# Patient Record
Sex: Female | Born: 1965
Health system: Southern US, Community
[De-identification: ages and names within clinical notes are randomized; demographics above are authoritative.]

## PROBLEM LIST (undated history)

## (undated) DIAGNOSIS — T7840XA Allergy, unspecified, initial encounter: Secondary | ICD-10-CM

## (undated) DIAGNOSIS — F419 Anxiety disorder, unspecified: Secondary | ICD-10-CM

## (undated) HISTORY — DX: Anxiety disorder, unspecified: F41.9

## (undated) HISTORY — PX: WISDOM TOOTH EXTRACTION: SHX21

## (undated) HISTORY — DX: Allergy, unspecified, initial encounter: T78.40XA

## (undated) HISTORY — PX: OTHER SURGICAL HISTORY: SHX169

---

## 1997-07-15 ENCOUNTER — Other Ambulatory Visit: Admission: RE | Admit: 1997-07-15 | Discharge: 1997-07-15 | Payer: Self-pay | Admitting: Obstetrics and Gynecology

## 1998-05-30 ENCOUNTER — Other Ambulatory Visit: Admission: RE | Admit: 1998-05-30 | Discharge: 1998-05-30 | Payer: Self-pay | Admitting: Obstetrics and Gynecology

## 1998-12-15 ENCOUNTER — Inpatient Hospital Stay (HOSPITAL_COMMUNITY): Admission: AD | Admit: 1998-12-15 | Discharge: 1998-12-17 | Payer: Self-pay | Admitting: Obstetrics and Gynecology

## 1999-01-22 ENCOUNTER — Other Ambulatory Visit: Admission: RE | Admit: 1999-01-22 | Discharge: 1999-01-22 | Payer: Self-pay | Admitting: Obstetrics and Gynecology

## 2000-02-20 ENCOUNTER — Other Ambulatory Visit: Admission: RE | Admit: 2000-02-20 | Discharge: 2000-02-20 | Payer: Self-pay | Admitting: Obstetrics and Gynecology

## 2001-04-13 ENCOUNTER — Other Ambulatory Visit: Admission: RE | Admit: 2001-04-13 | Discharge: 2001-04-13 | Payer: Self-pay | Admitting: Obstetrics and Gynecology

## 2002-05-13 ENCOUNTER — Other Ambulatory Visit: Admission: RE | Admit: 2002-05-13 | Discharge: 2002-05-13 | Payer: Self-pay | Admitting: Obstetrics and Gynecology

## 2003-05-27 ENCOUNTER — Other Ambulatory Visit: Admission: RE | Admit: 2003-05-27 | Discharge: 2003-05-27 | Payer: Self-pay | Admitting: Obstetrics and Gynecology

## 2004-09-19 ENCOUNTER — Other Ambulatory Visit: Admission: RE | Admit: 2004-09-19 | Discharge: 2004-09-19 | Payer: Self-pay | Admitting: Obstetrics and Gynecology

## 2017-01-10 DIAGNOSIS — L929 Granulomatous disorder of the skin and subcutaneous tissue, unspecified: Secondary | ICD-10-CM | POA: Diagnosis not present

## 2017-01-13 DIAGNOSIS — L980 Pyogenic granuloma: Secondary | ICD-10-CM | POA: Diagnosis not present

## 2017-02-05 DIAGNOSIS — Z0000 Encounter for general adult medical examination without abnormal findings: Secondary | ICD-10-CM | POA: Diagnosis not present

## 2017-02-05 DIAGNOSIS — Z01419 Encounter for gynecological examination (general) (routine) without abnormal findings: Secondary | ICD-10-CM | POA: Diagnosis not present

## 2017-02-05 DIAGNOSIS — Z1231 Encounter for screening mammogram for malignant neoplasm of breast: Secondary | ICD-10-CM | POA: Diagnosis not present

## 2017-02-15 DIAGNOSIS — R42 Dizziness and giddiness: Secondary | ICD-10-CM | POA: Diagnosis not present

## 2017-02-15 DIAGNOSIS — N10 Acute tubulo-interstitial nephritis: Secondary | ICD-10-CM | POA: Diagnosis not present

## 2017-02-15 DIAGNOSIS — I959 Hypotension, unspecified: Secondary | ICD-10-CM | POA: Diagnosis not present

## 2017-02-24 ENCOUNTER — Encounter: Payer: Self-pay | Admitting: Family Medicine

## 2017-02-24 ENCOUNTER — Ambulatory Visit: Payer: 59 | Admitting: Family Medicine

## 2017-02-24 VITALS — BP 110/78 | HR 60 | Temp 98.0°F | Ht 63.0 in | Wt 136.0 lb

## 2017-02-24 DIAGNOSIS — Z23 Encounter for immunization: Secondary | ICD-10-CM | POA: Diagnosis not present

## 2017-02-24 DIAGNOSIS — Z1211 Encounter for screening for malignant neoplasm of colon: Secondary | ICD-10-CM | POA: Diagnosis not present

## 2017-02-24 NOTE — Progress Notes (Signed)
[name] at [facility] [address], [place], [place] [address] [phone]  Date:  02/24/2017   Name:  Nichole Mccarty   DOB:  06-Nov-1965   MRN:  [id number]  PCP:  Darreld Mclean, MD    Chief Complaint: Establish Care (Pt here to est care. Following up on recent kidney infection, finished ABX today. )   History of Present Illness:  Nichole Mccarty is a 51 y.o. 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, hospital In her free time she enjoys yoga, hiking, biking She had 2 vaginal births, otherwise she has been generally healthy  She does use lexapro 10, and myrbetreiq which she uses prn when she will not be near a bathroom  Her mamo is per her GYN, and pap on 02/05/17 She has not yet done colon cancer screening- she did go for a consultation for a colonoscopy She would like to have cologuard - we will arrange this She gets labs drawn at her GYN office Last tetanus - may have been many years ago She will also get a flu shot today  Reviewed her recent labs from GYN 11/28- CBC, CMP, lipids, TSH, pap- all look ok   There are no active problems to display for this patient.   No past medical history on file.    Social History   Tobacco Use  . Smoking status: Never Smoker  . Smokeless tobacco: Never Used  Substance Use Topics  . Alcohol use: No   Frequency: Never  . Drug use: No    No family history on file.  Allergies  Allergen Reactions  . No Known Allergies     Medication list has been reviewed and updated.  Current Outpatient Medications on File Prior to Visit  Medication Sig Dispense Refill  . escitalopram (LEXAPRO) 10 MG tablet escitalopram 10 mg tablet    . levonorgestrel (MIRENA, 52 MG,) 20 MCG/24HR IUD Mirena 20 mcg/24 hr (5 years) intrauterine device  Take by intrauterine route.    . ciprofloxacin (CIPRO) 500 MG tablet TK 1 T PO BID WF  0  . meclizine (ANTIVERT) 25 MG tablet TK 1 T PO TID PRN  0   No current facility-administered medications on file prior to visit.     Review of Systems:  As per HPI- otherwise negative. No fever or chills No CP or SOB   Physical Examination: Vitals:   02/24/17 1529  BP: 110/78  Pulse: 60  Temp: 98 F (36.7 C)  SpO2: 98%   There were no vitals filed for this visit. There is no height or weight on file to calculate BMI. Ideal Body Weight:    GEN: WDWN, NAD, Non-toxic, A & O x 3, looks well, overweight HEENT: Atraumatic, Normocephalic. Neck supple. No  masses, No LAD.  Bilateral TM wnl, oropharynx normal.  PEERL,EOMI.   Ears and Nose: No external deformity. CV: RRR, No M/G/R. No JVD. No thrill. No extra heart sounds. PULM: CTA B, no wheezes, crackles, rhonchi. No retractions. No resp. distress. No accessory muscle use. ABD: S, NT, ND EXTR: No c/c/e NEURO Normal gait.  PSYCH: Normally interactive. Conversant. Not depressed or anxious appearing.  Calm demeanor.    Assessment and Plan: Immunization due - Plan: Tdap vaccine greater than or equal to 7yo IM, Flu Vaccine QUAD 36+ mos IM  Colon cancer screening  Updated tdap and flu today Ordered cologuard for her Otherwise she does not have any concerns today  She will see me as needed   Signed Lamar Blinks, MD

## 2017-02-24 NOTE — Patient Instructions (Addendum)
It was nice to see you today!  Take care and let me know if you need anything We will set you up for a cologuard test- do double check with your insurance to make sure they cover this test  You got your tetanus and flu shots today

## 2017-03-02 ENCOUNTER — Encounter: Payer: Self-pay | Admitting: Family Medicine

## 2017-05-06 NOTE — Progress Notes (Signed)
[facility] [address] [place], [place] [address] Phone: [phone] Subjective:    I'm seeing this patient by the request  of:   Copland, Gay Filler, MD   CC: Right knee pain  [id number]  Nichole Mccarty is a 52 y.o. female coming in with complaint of right knee pain. She states she walked 10 miles one day and ran 8 miles back to back when she felt her pain. Has stopped running but continues to walk on an incline. Was swollen initially.   Onset- Christmas day Location- Medial Duration- Wakes her up at night Character- Aches  Aggravating factors- Certain yoga poses Reliving factors-  Therapies tried- Aleve, RICE Severity-6 out of 10.     History reviewed. No pertinent past medical history. History reviewed. No pertinent surgical history. Social History   Socioeconomic History  . Marital status: Single    Spouse name: None  . Number of children: None  . Years of education: None  . Highest education level: None  Social Needs  . Financial resource strain: None  . Food insecurity - worry: None  . Food insecurity - inability: None  . Transportation needs - medical: None  . Transportation needs - non-medical: None  Occupational History  . Occupation: Sales  Tobacco Use  . Smoking status: Never Smoker  . Smokeless tobacco: Never Used  Substance and Sexual Activity  . Alcohol use: No    Frequency: Never  . Drug use: No  . Sexual activity: None  Other Topics Concern  . None  Social History Narrative  . None   Allergies  Allergen Reactions  . No Known Allergies    History reviewed. No pertinent family history.   Past medical history, social, surgical and family history all reviewed in electronic medical record.  No pertanent information unless stated regarding to the chief complaint.   Review of Systems:Review of systems updated and as accurate as of 05/07/17  No headache, visual changes, nausea, vomiting, diarrhea,  constipation, dizziness, abdominal pain, skin rash, fevers, chills, night sweats, weight loss, swollen lymph nodes, body aches, joint swelling, muscle aches, chest pain, shortness of breath, mood changes.   Objective  Blood pressure 118/80, pulse 63, height 5\' 2"  (1.575 m), weight 135 lb (61.2 kg), SpO2 98 %. Systems examined below as of 05/07/17   General: No apparent distress alert and oriented x3 mood and affect normal, dressed appropriately.  HEENT: Pupils equal, extraocular movements intact  Respiratory: Patient's speak in full sentences and does not appear short of breath  Cardiovascular: No lower extremity edema, non tender, no erythema  Skin: Warm dry intact with no signs of infection or rash on extremities or on axial skeleton.  Abdomen: Soft nontender  Neuro: Cranial nerves II through XII are intact, neurovascularly intact in all extremities with 2+ DTRs and 2+ pulses.  Lymph: No lymphadenopathy of posterior or anterior cervical chain or axillae bilaterally.  Gait normal with good balance and coordination.  MSK:  Non tender with full range of motion and good stability and symmetric strength and tone of shoulders, elbows, wrist, hip and ankles bilaterally.  Knee: Right Normal to inspection with no erythema or effusion or obvious bony abnormalities. Tender over the medial joint line mild overlying lipoma noted ROM full in flexion and extension and lower leg rotation. Mild laxity MCL but does have a positive endpoint.  All other ligaments intact Negative Mcmurray's, Apley's, and Thessalonian tests. Non painful patellar compression. Patellar glide with mild  to moderate crepitus. Patellar and quadriceps tendons unremarkable. Hamstring and quadriceps strength is normal. Contralateral knee unremarkable  MSK US performed of: Right knee  this study was ordered, performed, and interpreted by Charlann Boxer D.O.  Knee: Medial meniscus does show some degenerative changes but no acute tear  appreciated.  Patient's MCL does have a partial articular side tear proximally.  No significant gapping noted.  Mild tricompartmental arthritis.  Synovitis noted of the patellofemoral joint.  IMPRESSION: Mild osteoarthritic changes, synovitis, partial MCL tear    Impression and Recommendations:     This case required medical decision making of moderate complexity.      Note: This dictation was prepared with Dragon dictation along with smaller phrase technology. Any transcriptional errors that result from this process are unintentional.

## 2017-05-07 ENCOUNTER — Ambulatory Visit: Payer: Self-pay

## 2017-05-07 ENCOUNTER — Other Ambulatory Visit: Payer: Self-pay

## 2017-05-07 ENCOUNTER — Ambulatory Visit: Payer: 59 | Admitting: Family Medicine

## 2017-05-07 ENCOUNTER — Encounter: Payer: Self-pay | Admitting: Family Medicine

## 2017-05-07 ENCOUNTER — Ambulatory Visit (INDEPENDENT_AMBULATORY_CARE_PROVIDER_SITE_OTHER)
Admission: RE | Admit: 2017-05-07 | Discharge: 2017-05-07 | Disposition: A | Discharge status: Home / Self Care | Payer: 59 | Source: Ambulatory Visit | Attending: Family Medicine | Admitting: Family Medicine

## 2017-05-07 VITALS — BP 118/80 | HR 63 | Ht 62.0 in | Wt 135.0 lb

## 2017-05-07 DIAGNOSIS — M25561 Pain in right knee: Principal | ICD-10-CM

## 2017-05-07 DIAGNOSIS — G8929 Other chronic pain: Secondary | ICD-10-CM

## 2017-05-07 DIAGNOSIS — S83411A Sprain of medial collateral ligament of right knee, initial encounter: Secondary | ICD-10-CM

## 2017-05-07 MED ORDER — DICLOFENAC SODIUM 2 % TD SOLN: 2.0000 g | Freq: Two times a day (BID) | TRANSDERMAL | 3 refills | Status: DC

## 2017-05-07 MED ORDER — MELOXICAM 15 MG PO TABS: 15.0000 mg | ORAL_TABLET | Freq: Every day | ORAL | 0 refills | Status: DC

## 2017-05-07 NOTE — Patient Instructions (Signed)
Good to see you  Partial MCL tear.  pennsaid pinkie amount topically 2 times daily as needed.  Meloxicam daily for 10 days Wear brace with working out.  Ice 20 minutes 2 times daily. Usually after activity and before bed. Biking or elliptical would be better Vitamin D 2000 IU daily  Turmeric 500mg  twice daily  See me again in 4 weeks.

## 2017-05-07 NOTE — Progress Notes (Unsigned)
pennsaid

## 2017-05-07 NOTE — Assessment & Plan Note (Signed)
Partial tear noted.  Does have a synovitis.  X-rays ordered today.  Mild to moderate arthritic changes.  Discussed with patient given brace, home exercises, topical anti-inflammatories.  If patient has any worsening symptoms advanced imaging would be warranted if there is any locking or giving out on her.  Follow-up again in 4 weeks

## 2017-05-21 ENCOUNTER — Telehealth: Payer: Self-pay

## 2017-05-21 NOTE — Telephone Encounter (Signed)
Patient called to inquire about the price of the brace. Told her that the hinged knee brace is billed out at $156.89. Provided patient with [employer] phone number  [phone] in which she can call to return brace. Advised that brace needs to be returned within 14 day time frame and that she should call today but can bring the brace into the office today or tomorrow if she decides to return it. Patient is going to check again with her insurance company to note coverage before making final decision.

## 2017-06-02 ENCOUNTER — Other Ambulatory Visit: Payer: Self-pay | Admitting: Family Medicine

## 2017-06-04 ENCOUNTER — Ambulatory Visit: Payer: 59 | Admitting: Family Medicine

## 2017-06-04 ENCOUNTER — Encounter: Payer: Self-pay | Admitting: Family Medicine

## 2017-06-04 DIAGNOSIS — S83411A Sprain of medial collateral ligament of right knee, initial encounter: Secondary | ICD-10-CM | POA: Diagnosis not present

## 2017-06-04 NOTE — Progress Notes (Signed)
[facility] [place], [place] [address] Phone: [phone] Subjective:    I'm seeing this patient by the request  of:    CC: Knee pain follow-up  [id number]  Nichole Mccarty is a 52 y.o. female coming in with complaint of knee pain.  Found to have more of an MCL partial tear.  Patient has been wearing the brace, doing home exercises, topical anti-inflammatories.  Patient feels like the exercises are helping a little bit.  Has not been running so does not know how much she is truly improving.     No past medical history on file. No past surgical history on file. Social History   Socioeconomic History  . Marital status: Single    Spouse name: Not on file  . Number of children: Not on file  . Years of education: Not on file  . Highest education level: Not on file  Occupational History  . Occupation: Press photographer  Social Needs  . Financial resource strain: Not on file  . Food insecurity:    Worry: Not on file    Inability: Not on file  . Transportation needs:    Medical: Not on file    Non-medical: Not on file  Tobacco Use  . Smoking status: Never Smoker  . Smokeless tobacco: Never Used  Substance and Sexual Activity  . Alcohol use: No    Frequency: Never  . Drug use: No  . Sexual activity: Not on file  Lifestyle  . Physical activity:    Days per week: Not on file    Minutes per session: Not on file  . Stress: Not on file  Relationships  . Social connections:    Talks on phone: Not on file    Gets together: Not on file    Attends religious service: Not on file    Active member of club or organization: Not on file    Attends meetings of clubs or organizations: Not on file    Relationship status: Not on file  Other Topics Concern  . Not on file  Social History Narrative  . Not on file   Allergies  Allergen Reactions  . No Known Allergies    No family history on file.   Past medical history, social, surgical and family  history all reviewed in electronic medical record.  No pertanent information unless stated regarding to the chief complaint.   Review of Systems:Review of systems updated and as accurate as of 06/04/17  No headache, visual changes, nausea, vomiting, diarrhea, constipation, dizziness, abdominal pain, skin rash, fevers, chills, night sweats, weight loss, swollen lymph nodes, body aches, joint swelling, muscle aches, chest pain, shortness of breath, mood changes.   Objective  Blood pressure 110/80, pulse 88, height 5\' 2"  (1.575 m), weight 131 lb (59.4 kg), SpO2 98 %. Systems examined below as of 06/04/17   General: No apparent distress alert and oriented x3 mood and affect normal, dressed appropriately.  HEENT: Pupils equal, extraocular movements intact  Respiratory: Patient's speak in full sentences and does not appear short of breath  Cardiovascular: No lower extremity edema, non tender, no erythema  Skin: Warm dry intact with no signs of infection or rash on extremities or on axial skeleton.  Abdomen: Soft nontender  Neuro: Cranial nerves II through XII are intact, neurovascularly intact in all extremities with 2+ DTRs and 2+ pulses.  Lymph: No lymphadenopathy of posterior or anterior cervical chain or axillae bilaterally.  Gait normal with  good balance and coordination.  MSK:  Non tender with full range of motion and good stability and symmetric strength and tone of shoulders, elbows, wrist, hip, and ankles bilaterally.  Knee: Right Normal to inspection with no erythema or effusion or obvious bony abnormalities. Palpation mild discomfort over the medial joint space ROM full in flexion and extension and lower leg rotation. Ligaments with solid consistent endpoints including ACL, PCL, LCL, MCL. Negative Mcmurray's, Apley's, and Thessalonian tests. Very mild painful patellar compression. Patellar glide with mild crepitus. Patellar and quadriceps tendons unremarkable. Hamstring and  quadriceps strength is normal. Contralateral knee unremarkable     Impression and Recommendations:     This case required medical decision making of moderate complexity.      Note: This dictation was prepared with Dragon dictation along with smaller phrase technology. Any transcriptional errors that result from this process are unintentional.

## 2017-06-04 NOTE — Patient Instructions (Signed)
Good to see you  I think you are doing great  Brace with running  Start a walk-run progression: - Initially start one minute walking than one minute running for 30 mins in the first week,  - Run 2 mins, then walk 1 min second week  -Then run 3 mins, and walk 1 min. -Then run 4 mins, and walk 1 min. -Then run 5 mins, and walk 1 min. -Slowly build up weekly to running 30 mins nonstop.  Ice right after running  OK to continue the vitamins After 2 weeks stop the ibuprofen and aleve if you can  See me again in 4 weeks but hike before you see me  See you again in 4 weeks

## 2017-06-04 NOTE — Assessment & Plan Note (Signed)
Seems to be improving.  Running progression given, discussed icing regimen and home exercise.  No significant swelling noted.  Follow-up again in 4-6 weeks

## 2017-06-06 ENCOUNTER — Encounter: Payer: Self-pay | Admitting: Family Medicine

## 2017-06-06 DIAGNOSIS — H40013 Open angle with borderline findings, low risk, bilateral: Secondary | ICD-10-CM | POA: Diagnosis not present

## 2017-06-06 DIAGNOSIS — H40053 Ocular hypertension, bilateral: Secondary | ICD-10-CM | POA: Diagnosis not present

## 2017-06-16 ENCOUNTER — Other Ambulatory Visit: Payer: Self-pay

## 2017-06-16 ENCOUNTER — Encounter: Payer: Self-pay | Admitting: Family Medicine

## 2017-06-16 DIAGNOSIS — G8929 Other chronic pain: Secondary | ICD-10-CM

## 2017-06-16 DIAGNOSIS — M25561 Pain in right knee: Principal | ICD-10-CM

## 2017-07-01 NOTE — Progress Notes (Signed)
[facility] [place], [place] [address] Phone: [phone] Subjective:      CC: Right knee pain  [id number]  Nichole Mccarty is a 52 y.o. female coming in with complaint of right knee pain. She did try running which caused pain for a couple days. She has not ran since that incident. Patient did turn on the right leg with her foot planted. She had instant sharp pain. She is able to ride a bike without pain. She is waking up in pain. Has been using ice, IBU and Aleve.  Patient was seen previously and had more of a sprain with a partial tear of the MCL.  Patient states that this feels a little bit different.  Has not run since the incident.      No past medical history on file. No past surgical history on file. Social History   Socioeconomic History  . Marital status: Single    Spouse name: Not on file  . Number of children: Not on file  . Years of education: Not on file  . Highest education level: Not on file  Occupational History  . Occupation: Press photographer  Social Needs  . Financial resource strain: Not on file  . Food insecurity:    Worry: Not on file    Inability: Not on file  . Transportation needs:    Medical: Not on file    Non-medical: Not on file  Tobacco Use  . Smoking status: Never Smoker  . Smokeless tobacco: Never Used  Substance and Sexual Activity  . Alcohol use: No    Frequency: Never  . Drug use: No  . Sexual activity: Not on file  Lifestyle  . Physical activity:    Days per week: Not on file    Minutes per session: Not on file  . Stress: Not on file  Relationships  . Social connections:    Talks on phone: Not on file    Gets together: Not on file    Attends religious service: Not on file    Active member of club or organization: Not on file    Attends meetings of clubs or organizations: Not on file    Relationship status: Not on file  Other Topics Concern  . Not on file  Social History Narrative  . Not on  file   Allergies  Allergen Reactions  . No Known Allergies    No family history on file.   Past medical history, social, surgical and family history all reviewed in electronic medical record.  No pertanent information unless stated regarding to the chief complaint.   Review of Systems:Review of systems updated and as accurate as of 07/01/17  No headache, visual changes, nausea, vomiting, diarrhea, constipation, dizziness, abdominal pain, skin rash, fevers, chills, night sweats, weight loss, swollen lymph nodes, body aches, joint swelling, muscle aches, chest pain, shortness of breath, mood changes.   Objective  There were no vitals taken for this visit. Systems examined below as of 07/01/17   General: No apparent distress alert and oriented x3 mood and affect normal, dressed appropriately.  HEENT: Pupils equal, extraocular movements intact  Respiratory: Patient's speak in full sentences and does not appear short of breath  Cardiovascular: No lower extremity edema, non tender, no erythema  Skin: Warm dry intact with no signs of infection or rash on extremities or on axial skeleton.  Abdomen: Soft nontender  Neuro: Cranial nerves II through XII are intact, neurovascularly intact in  all extremities with 2+ DTRs and 2+ pulses.  Lymph: No lymphadenopathy of posterior or anterior cervical chain or axillae bilaterally.  Gait normal with good balance and coordination.  MSK:  Non tender with full range of motion and good stability and symmetric strength and tone of shoulders, elbows, wrist, hip, and ankles bilaterally.  Knee: Right Normal to inspection with no erythema or effusion or obvious bony abnormalities. Palpation normal with no warmth, joint line tenderness, patellar tenderness, or condyle tenderness. ROM full in flexion and extension and lower leg rotation. Ligaments with solid consistent endpoints including ACL, PCL, LCL, some laxity of MCL but does have an endpoint Rebound positive  Mcmurray's, Apley's, and Thessalonian tests. Non painful patellar compression. Patellar glide with mild crepitus. Patellar and quadriceps tendons unremarkable. Hamstring and quadriceps strength is normal.  MSK US performed of: Right knee This study was ordered, performed, and interpreted by Charlann Boxer D.O.  Knee: MCL tear noted previously does show approximately 30% healing noted.  Scar tissue formation noted.  Dynamic testing does not show any significant increase in gapping.  Patient does have an underlying new acute meniscal tear with minimal displacement of less than 25%.  Hypoechoic changes noted.  IMPRESSION:  .  Partial healing of the MCL, new acute medial meniscal tear   Impression and Recommendations:     This case required medical decision making of moderate complexity.      Note: This dictation was prepared with Dragon dictation along with smaller phrase technology. Any transcriptional errors that result from this process are unintentional.

## 2017-07-02 ENCOUNTER — Ambulatory Visit: Payer: 59 | Admitting: Family Medicine

## 2017-07-02 ENCOUNTER — Ambulatory Visit: Payer: Self-pay

## 2017-07-02 ENCOUNTER — Encounter: Payer: Self-pay | Admitting: Family Medicine

## 2017-07-02 ENCOUNTER — Other Ambulatory Visit: Payer: Self-pay | Admitting: Family Medicine

## 2017-07-02 VITALS — BP 110/70 | HR 66 | Ht 62.0 in | Wt 132.0 lb

## 2017-07-02 DIAGNOSIS — G8929 Other chronic pain: Secondary | ICD-10-CM

## 2017-07-02 DIAGNOSIS — M25561 Pain in right knee: Secondary | ICD-10-CM

## 2017-07-02 DIAGNOSIS — S83411A Sprain of medial collateral ligament of right knee, initial encounter: Secondary | ICD-10-CM

## 2017-07-02 DIAGNOSIS — S83242A Other tear of medial meniscus, current injury, left knee, initial encounter: Secondary | ICD-10-CM

## 2017-07-02 NOTE — Assessment & Plan Note (Signed)
As stated above. 

## 2017-07-02 NOTE — Assessment & Plan Note (Signed)
Healing interval but now a meniscal tear.  Patient is to be starting physical therapy in the near future.  We discussed icing regimen.  Patient declined any advanced imaging.  Patient will try to increase activity as tolerated.  Patient has a hiking trip in June and if not better will consider injection at follow-up.

## 2017-07-02 NOTE — Patient Instructions (Signed)
Good to see you  Ice Is your friend.  Ice 20 minutes 2 times daily. Usually after activity and before bed. Keep using the pennsaid at night Physical therapy will be great  Biking, elliptical and swimming would be fine See me again in 5 weeks or so and if needed we will do injection for the knee before your hike.

## 2017-07-03 NOTE — Telephone Encounter (Signed)
Refill done.  

## 2017-07-09 ENCOUNTER — Encounter: Payer: Self-pay | Admitting: Physical Therapy

## 2017-07-09 ENCOUNTER — Ambulatory Visit: Payer: 59 | Attending: Family Medicine | Admitting: Physical Therapy

## 2017-07-09 DIAGNOSIS — M25561 Pain in right knee: Secondary | ICD-10-CM | POA: Diagnosis present

## 2017-07-09 NOTE — Therapy (Addendum)
[facility] [place], [place], [address] Phone: [phone]   Fax:  [phone]  Physical Therapy Evaluation  Patient Details  Name: Nichole Mccarty MRN: [id number] Date of Birth: [birth year] Referring Provider: Creig Hines   Encounter Date: 07/09/2017  PT End of Session - 07/09/17 1618    Visit Number  1    Date for PT Re-Evaluation  09/08/17    PT Start Time  7048    PT Stop Time  1530    PT Time Calculation (min)  45 min    Activity Tolerance  Patient tolerated treatment well    Behavior During Therapy  [facility] for tasks assessed/performed       History reviewed. No pertinent past medical history.  History reviewed. No pertinent surgical history.  There were no vitals filed for this visit.   Subjective Assessment - 07/09/17 1458    Subjective  Patient reports that she started having pain on a 6 mile run, she was diagnosed with a meniscus and MCL tear, partial.  She reports that she a month ago she twisted at the grocery store and flared up the pain again.  She reports that she rested and put ice on it and it is getting better, she has a brace and has done some walking    Limitations  Walking    Patient Stated Goals  go on a long hike in [place]    Currently in Pain?  Yes    Pain Score  1     Pain Location  Knee    Pain Orientation  Right    Pain Descriptors / Indicators  Aching;Dull    Pain Onset  More than a month ago    Pain Frequency  Intermittent    Aggravating Factors   twisting, running pain in the last month up to 6/10    Pain Relieving Factors  rest, ibuprofen, ice pain has been 0-1/10 over the past week    Effect of Pain on Daily Activities  has not been doing the normal exercise routine         [facility] PT Assessment - 07/09/17 0001      Assessment   Medical Diagnosis  partial medial meniscus and partial MCL tear    Referring Provider  Z. Smith    Onset Date/Surgical Date  07/09/17    Prior  Therapy  no      Precautions   Precautions  None      Balance Screen   Has the patient fallen in the past 6 months  No    Has the patient had a decrease in activity level because of a fear of falling?   No    Is the patient reluctant to leave their home because of a fear of falling?   No      Home Environment   Additional Comments  has stairs, some yardwork,       Prior Function   Level of Independence  Independent    Vocation  Full time employment    [employer], a lot of sitting      ROM / Strength   AROM / PROM / Strength  AROM;Strength      AROM   Overall AROM Comments  AROM of the right knee is WNL's with mild medial joint line pain with full flexion      Strength   Overall Strength Comments  WFL's mild pain iwth resisted  flexion      Flexibility   Soft Tissue Assessment /Muscle Length  yes    ITB  tight      Palpation   Palpation comment  some tenderness medial joint line, she has crepitus of the patellae with extension, she has some lateral tracking patella as well, the ITB feels very tight      Ambulation/Gait   Gait Comments  no deviation                Objective measurements completed on examination: See above findings.      [facility] PT Treatment/Exercise - 07/09/17 0001      Modalities   Modalities  Iontophoresis      Iontophoresis   Type of Iontophoresis  Dexamethasone    Location  right medial knee    Dose  5m    Time  4 hour patch             PT Education - 07/09/17 1618    Education provided  Yes    Education Details  spoke about safely starting hiking and how to limit rotational stresses    Person(s) Educated  Patient    Methods  Explanation    Comprehension  Verbalized understanding       PT Short Term Goals - 07/09/17 1729      PT SHORT TERM GOAL #1   Title  independent with initial HEP    Time  1    Period  Days    Status  Achieved        PT Long Term Goals - 07/09/17 1729      PT  LONG TERM GOAL #1   Title  decrease pain 50%    Time  8    Period  Weeks    Status  New      PT LONG TERM GOAL #2   Title  go on hiking vacation without difficulty    Time  8    Period  Weeks    Status  New             Plan - 07/09/17 1704    Clinical Impression Statement  Patient reports right knee pain starting during a run on Christmas.  She reports that she got some better and recently she twisted on the leg and had the pain return, she has been diagnosed with partial MCL tear and medial meniscus tear, she does not want surgery.  She is very active but I cautioned her to avoid hyper flexion that she was doing in some of her exercise routines.  Her ROM and strength is WNL's.  She is planning a bike hiking vacation in 1 month and is looking to help the knee.  She is tight in the ITB    Clinical Presentation  Stable    Clinical Decision Making  Low    Rehab Potential  Good    PT Frequency  1x / week    PT Duration  8 weeks    PT Treatment/Interventions  Cryotherapy;Electrical Stimulation;Iontophoresis 459mml Dexamethasone;Gait training;Stair training;Functional mobility training;Therapeutic activities;Therapeutic exercise;Balance training;Neuromuscular re-education;Manual techniques;Patient/family education    PT Next Visit Plan  she will try exercises, we ,may see again if ionto helped,     Consulted and Agree with Plan of Care  Patient       Patient will benefit from skilled therapeutic intervention in order to improve the following deficits and impairments:  Decreased range of motion, Difficulty walking, Pain, Impaired flexibility  Visit Diagnosis: Acute pain of right knee - Plan: PT plan of care cert/re-cert     Problem List Patient Active Problem List   Diagnosis Date Noted  . Acute medial meniscal tear, left, initial encounter 07/02/2017  . Tear of MCL (medial collateral ligament) of knee, right, initial encounter 05/07/2017  PHYSICAL THERAPY DISCHARGE  SUMMARY  Visits from Start of Care: 1   Plan: Patient agrees to discharge.  Patient goals were partially met. Patient is being discharged due to being pleased with the current functional level.  ?????       Sumner Boast., PT 07/09/2017, 5:41 PM  [place], [place], [address] Phone: [phone]   Fax:  [phone]  Name: Nichole Mccarty MRN: [id number] Date of Birth: [birth year]

## 2017-08-06 ENCOUNTER — Ambulatory Visit: Payer: 59 | Admitting: Family Medicine

## 2017-08-07 ENCOUNTER — Encounter: Payer: Self-pay | Admitting: Family Medicine

## 2017-08-07 ENCOUNTER — Ambulatory Visit: Payer: 59 | Admitting: Family Medicine

## 2017-08-07 DIAGNOSIS — S83242A Other tear of medial meniscus, current injury, left knee, initial encounter: Secondary | ICD-10-CM | POA: Diagnosis not present

## 2017-08-07 NOTE — Progress Notes (Signed)
[facility] [place], [place] [address] Phone: [phone] Subjective:      CC: Knee pain  HCW:[id number]  Nichole Mccarty is a 52 y.o. female coming in with complaint of knee pain. She has been doing physical therapy. She has been using a medication patch on the knee from the therapist. Patient did hike on [place] wearing the knee brace. Did not have any pain with that activity. Did use Aleve.  Patient was found to have a partial MCL tear that seem to be healing and will start running progression.  Some degenerative meniscal tearing noted and some mild to moderate arthritic changes.  Patient will be doing a lot of hiking and feels that something needs to be done to help her make sure she does not have significant amount of pain.    No past medical history on file. No past surgical history on file. Social History   Socioeconomic History  . Marital status: Single    Spouse name: Not on file  . Number of children: Not on file  . Years of education: Not on file  . Highest education level: Not on file  Occupational History  . Occupation: Press photographer  Social Needs  . Financial resource strain: Not on file  . Food insecurity:    Worry: Not on file    Inability: Not on file  . Transportation needs:    Medical: Not on file    Non-medical: Not on file  Tobacco Use  . Smoking status: Never Smoker  . Smokeless tobacco: Never Used  Substance and Sexual Activity  . Alcohol use: No    Frequency: Never  . Drug use: No  . Sexual activity: Not on file  Lifestyle  . Physical activity:    Days per week: Not on file    Minutes per session: Not on file  . Stress: Not on file  Relationships  . Social connections:    Talks on phone: Not on file    Gets together: Not on file    Attends religious service: Not on file    Active member of club or organization: Not on file    Attends meetings of clubs or organizations: Not on file    Relationship  status: Not on file  Other Topics Concern  . Not on file  Social History Narrative  . Not on file   Allergies  Allergen Reactions  . No Known Allergies    No family history on file.   Past medical history, social, surgical and family history all reviewed in electronic medical record.  No pertanent information unless stated regarding to the chief complaint.   Review of Systems:Review of systems updated and as accurate as of 08/07/17  No headache, visual changes, nausea, vomiting, diarrhea, constipation, dizziness, abdominal pain, skin rash, fevers, chills, night sweats, weight loss, swollen lymph nodes, body aches, joint swelling, muscle aches, chest pain, shortness of breath, mood changes.   Objective  Blood pressure 112/68, pulse 72, height 5\' 2"  (1.575 m), weight 132 lb (59.9 kg), SpO2 98 %. Systems examined below as of 08/07/17   General: No apparent distress alert and oriented x3 mood and affect normal, dressed appropriately.  HEENT: Pupils equal, extraocular movements intact  Respiratory: Patient's speak in full sentences and does not appear short of breath  Cardiovascular: No lower extremity edema, non tender, no erythema  Skin: Warm dry intact with no signs of infection or rash on extremities or on  axial skeleton.  Abdomen: Soft nontender  Neuro: Cranial nerves II through XII are intact, neurovascularly intact in all extremities with 2+ DTRs and 2+ pulses.  Lymph: No lymphadenopathy of posterior or anterior cervical chain or axillae bilaterally.  Gait normal with good balance and coordination.  MSK:  Non tender with full range of motion and good stability and symmetric strength and tone of shoulders, elbows, wrist, hip, and ankles bilaterally.  Right knee exam shows the patient has near full range of motion.  Still some mild laxity but good endpoint mild positive McMurray still noted.  Mild crepitus with range of motion.  Mild pain over the medial compartment  After informed  written and verbal consent, patient was seated on exam table. Right knee was prepped with alcohol swab and utilizing anterolateral approach, patient's right knee space was injected with 4:1  marcaine 0.5%: Kenalog 40mg /dL. Patient tolerated the procedure well without immediate complications.    Impression and Recommendations:     This case required medical decision making of moderate complexity.      Note: This dictation was prepared with Dragon dictation along with smaller phrase technology. Any transcriptional errors that result from this process are unintentional.

## 2017-08-07 NOTE — Assessment & Plan Note (Signed)
Injected Aug 07, 2017.  I think patient will do well with conservative therapy.  Has had difficulty with increasing her running secondary to the discomfort and pain.  We discussed icing regimen, home exercises, which activities of doing which wants to avoid.  Patient will follow-up with me again in 4 weeks

## 2017-08-07 NOTE — Patient Instructions (Signed)
Good to see you  I want to see pictures Stay active See me again in 4 weeks if you need me

## 2017-08-09 ENCOUNTER — Encounter: Payer: Self-pay | Admitting: Family Medicine

## 2017-09-03 ENCOUNTER — Ambulatory Visit: Payer: 59 | Admitting: Family Medicine

## 2017-10-08 ENCOUNTER — Ambulatory Visit: Payer: 59 | Admitting: Family Medicine

## 2017-11-16 ENCOUNTER — Encounter: Payer: Self-pay | Admitting: Family Medicine

## 2017-11-17 MED ORDER — ESCITALOPRAM OXALATE 10 MG PO TABS: ORAL_TABLET | ORAL | 3 refills | Status: DC

## 2017-11-17 NOTE — Telephone Encounter (Signed)
I have pended medication please advise.

## 2017-11-18 MED ORDER — ESCITALOPRAM OXALATE 10 MG PO TABS: ORAL_TABLET | ORAL | 3 refills | Status: DC

## 2017-11-18 NOTE — Addendum Note (Signed)
Addended by: Lamar Blinks C on: 11/18/2017 03:44 PM   Modules accepted: Orders

## 2017-11-21 MED ORDER — ESCITALOPRAM OXALATE 10 MG PO TABS: ORAL_TABLET | ORAL | 3 refills | Status: DC

## 2017-11-21 NOTE — Addendum Note (Signed)
Addended by: Lamar Blinks C on: 11/21/2017 03:33 PM   Modules accepted: Orders

## 2018-02-19 DIAGNOSIS — Z01419 Encounter for gynecological examination (general) (routine) without abnormal findings: Secondary | ICD-10-CM | POA: Diagnosis not present

## 2018-02-19 DIAGNOSIS — Z6824 Body mass index (BMI) 24.0-24.9, adult: Secondary | ICD-10-CM | POA: Diagnosis not present

## 2018-02-19 DIAGNOSIS — Z1231 Encounter for screening mammogram for malignant neoplasm of breast: Secondary | ICD-10-CM | POA: Diagnosis not present

## 2018-02-19 DIAGNOSIS — Z23 Encounter for immunization: Secondary | ICD-10-CM | POA: Diagnosis not present

## 2018-03-05 ENCOUNTER — Telehealth: Payer: Self-pay | Admitting: *Deleted

## 2018-03-05 NOTE — Telephone Encounter (Signed)
Received Mammogram results from [facility]; forwarded to provider/SLS 12/26

## 2018-04-06 DIAGNOSIS — F419 Anxiety disorder, unspecified: Secondary | ICD-10-CM | POA: Insufficient documentation

## 2018-04-07 DIAGNOSIS — H53481 Generalized contraction of visual field, right eye: Secondary | ICD-10-CM | POA: Diagnosis not present

## 2018-04-07 DIAGNOSIS — H02413 Mechanical ptosis of bilateral eyelids: Secondary | ICD-10-CM | POA: Diagnosis not present

## 2018-04-07 DIAGNOSIS — H02831 Dermatochalasis of right upper eyelid: Secondary | ICD-10-CM | POA: Diagnosis not present

## 2018-04-07 DIAGNOSIS — H02834 Dermatochalasis of left upper eyelid: Secondary | ICD-10-CM | POA: Diagnosis not present

## 2018-04-07 DIAGNOSIS — H0279 Other degenerative disorders of eyelid and periocular area: Secondary | ICD-10-CM | POA: Diagnosis not present

## 2018-04-07 DIAGNOSIS — H53482 Generalized contraction of visual field, left eye: Secondary | ICD-10-CM | POA: Diagnosis not present

## 2018-04-07 DIAGNOSIS — H53483 Generalized contraction of visual field, bilateral: Secondary | ICD-10-CM | POA: Diagnosis not present

## 2018-04-15 ENCOUNTER — Encounter: Payer: Self-pay | Admitting: Family Medicine

## 2018-04-16 ENCOUNTER — Other Ambulatory Visit: Payer: Self-pay | Admitting: Family Medicine

## 2018-05-02 NOTE — Progress Notes (Deleted)
[place] [facility] [address] [place], [place] [address] [phone]  Date:  05/06/2018   Name:  Nichole Mccarty   DOB:  08-09-1965   MRN:  [id number]  PCP:  Darreld Mclean, MD    Chief Complaint: No chief complaint on file.   History of Present Illness:  Nichole Mccarty is a 53 y.o. very pleasant female patient who presents with the following:  Here today for a follow-up visit-question physical exam I have so far seen her in the office once, in December 2018  She is generally in good health, single with 2 adult daughters She is in [employer], and enjoys exercise including yoga, hiking, biking  Colon cancer screening: Flu shot Mammo-is up-to-date Pap is up-to-date Can do routine labs today  Patient Active Problem List   Diagnosis Date Noted  . Acute medial meniscal tear, left, initial encounter 07/02/2017  . Tear of MCL (medial collateral ligament) of knee, right, initial encounter 05/07/2017    No past medical history on file.  No past surgical history on file.  Social History   Tobacco Use  . Smoking status: Never Smoker  . Smokeless tobacco: Never Used  Substance Use Topics  . Alcohol use: No    Frequency: Never  . Drug use: No    No family history on file.  Allergies  Allergen Reactions  . No Known Allergies     Medication list has been reviewed and updated.  Current Outpatient Medications on File Prior to Visit  Medication Sig Dispense Refill  . Calcium Carbonate (CALCIUM 600 PO) Take 1 tablet by mouth daily.    . ciprofloxacin (CIPRO) 500 MG tablet TK 1 T PO BID WF  0  . Diclofenac Sodium (PENNSAID) 2 % SOLN Place 2 g onto the skin 2 (two) times daily. 112 g 3  . escitalopram (LEXAPRO) 10 MG tablet Take 1 daily 90 tablet 3  . levonorgestrel (MIRENA, 52 MG,) 20 MCG/24HR IUD Mirena 20 mcg/24 hr (5 years) intrauterine device  Take by intrauterine route.    . meclizine (ANTIVERT) 25  MG tablet TK 1 T PO TID PRN  0  . meloxicam (MOBIC) 15 MG tablet TAKE 1 TABLET(15 MG) BY MOUTH DAILY 90 tablet 0  . mirabegron ER (MYRBETRIQ) 25 MG TB24 tablet Take 25 mg by mouth daily.     No current facility-administered medications on file prior to visit.     Review of Systems:  As per HPI- otherwise negative.   Physical Examination: There were no vitals filed for this visit. There were no vitals filed for this visit. There is no height or weight on file to calculate BMI. Ideal Body Weight:    GEN: WDWN, NAD, Non-toxic, A & O x 3 HEENT: Atraumatic, Normocephalic. Neck supple. No masses, No LAD. Ears and Nose: No external deformity. CV: RRR, No M/G/R. No JVD. No thrill. No extra heart sounds. PULM: CTA B, no wheezes, crackles, rhonchi. No retractions. No resp. distress. No accessory muscle use. ABD: S, NT, ND, +BS. No rebound. No HSM. EXTR: No c/c/e NEURO Normal gait.  PSYCH: Normally interactive. Conversant. Not depressed or anxious appearing.  Calm demeanor.    Assessment and Plan: ***  Signed Lamar Blinks, MD

## 2018-05-06 ENCOUNTER — Encounter: Payer: 59 | Admitting: Family Medicine

## 2018-05-12 DIAGNOSIS — H02831 Dermatochalasis of right upper eyelid: Secondary | ICD-10-CM | POA: Diagnosis not present

## 2018-05-12 DIAGNOSIS — H57813 Brow ptosis, bilateral: Secondary | ICD-10-CM | POA: Diagnosis not present

## 2018-05-12 DIAGNOSIS — H02834 Dermatochalasis of left upper eyelid: Secondary | ICD-10-CM | POA: Diagnosis not present

## 2018-08-28 ENCOUNTER — Encounter: Payer: Self-pay | Admitting: Family Medicine

## 2018-08-28 MED ORDER — ESCITALOPRAM OXALATE 10 MG PO TABS: 10.0000 mg | ORAL_TABLET | Freq: Every day | ORAL | 3 refills | Status: DC

## 2018-09-16 DIAGNOSIS — L814 Other melanin hyperpigmentation: Secondary | ICD-10-CM | POA: Diagnosis not present

## 2018-09-16 DIAGNOSIS — D485 Neoplasm of uncertain behavior of skin: Secondary | ICD-10-CM | POA: Diagnosis not present

## 2018-09-16 DIAGNOSIS — C44519 Basal cell carcinoma of skin of other part of trunk: Secondary | ICD-10-CM | POA: Diagnosis not present

## 2018-09-16 DIAGNOSIS — L821 Other seborrheic keratosis: Secondary | ICD-10-CM | POA: Diagnosis not present

## 2018-09-16 DIAGNOSIS — D225 Melanocytic nevi of trunk: Secondary | ICD-10-CM | POA: Diagnosis not present

## 2018-10-14 DIAGNOSIS — C44519 Basal cell carcinoma of skin of other part of trunk: Secondary | ICD-10-CM | POA: Diagnosis not present

## 2018-11-07 DIAGNOSIS — Z20828 Contact with and (suspected) exposure to other viral communicable diseases: Secondary | ICD-10-CM | POA: Diagnosis not present

## 2019-03-01 DIAGNOSIS — Z20828 Contact with and (suspected) exposure to other viral communicable diseases: Secondary | ICD-10-CM | POA: Diagnosis not present

## 2019-03-16 ENCOUNTER — Encounter: Payer: Self-pay | Admitting: Family Medicine

## 2019-03-17 NOTE — Telephone Encounter (Signed)
I have printed out the paperwork and placed in your folders for review.

## 2019-04-08 DIAGNOSIS — Z30432 Encounter for removal of intrauterine contraceptive device: Secondary | ICD-10-CM | POA: Diagnosis not present

## 2019-04-08 DIAGNOSIS — Z6825 Body mass index (BMI) 25.0-25.9, adult: Secondary | ICD-10-CM | POA: Diagnosis not present

## 2019-04-08 DIAGNOSIS — Z1231 Encounter for screening mammogram for malignant neoplasm of breast: Secondary | ICD-10-CM | POA: Diagnosis not present

## 2019-04-08 DIAGNOSIS — Z01419 Encounter for gynecological examination (general) (routine) without abnormal findings: Secondary | ICD-10-CM | POA: Diagnosis not present

## 2019-04-09 DIAGNOSIS — Z01419 Encounter for gynecological examination (general) (routine) without abnormal findings: Secondary | ICD-10-CM | POA: Diagnosis not present

## 2019-04-09 DIAGNOSIS — Z1151 Encounter for screening for human papillomavirus (HPV): Secondary | ICD-10-CM | POA: Diagnosis not present

## 2019-04-12 NOTE — Progress Notes (Addendum)
[place] [facility] [place], [place], [place] [address] [phone]  Date:  04/14/2019   Name:  Nichole Mccarty   DOB:  [birth year]   MRN:  [id number]  PCP:  Darreld Mclean, MD    Chief Complaint: Annual Exam (iud taken out last week-no pap)   History of Present Illness:  Nichole Mccarty is a 54 y.o. very pleasant female patient who presents with the following:  Generally healthy woman with history of MCL tear, here today for physical exam I have seen her once in the past, December 2018  She is single, 2 adult daughters She works in [employer], enjoys yoga, hiking, biking She does have Adrian, PA-C in [place] She just had her mirena out last week  Colon cancer screening; will order cologuard, no family history of colon cancer  Flu vaccine- give today Pap and mammogram are up-to-date Can suggest Shingrix- she is thinking about this  Likely needs routine labs- will do today  She notes that she will get a sort of stye on her eye once a year or so, she uses drops that she had on hand from last year - she had tobradex on hand She had this issue again yesterday, used her drops and it seems to resolve overnight She wonders if I can refill her TobraDex  Patient Active Problem List   Diagnosis Date Noted  . Acute medial meniscal tear, left, initial encounter 07/02/2017  . Tear of MCL (medial collateral ligament) of knee, right, initial encounter 05/07/2017    History reviewed. No pertinent past medical history.  History reviewed. No pertinent surgical history.  Social History   Tobacco Use  . Smoking status: Never Smoker  . Smokeless tobacco: Never Used  Substance Use Topics  . Alcohol use: No  . Drug use: No    History reviewed. No pertinent family history.  Allergies  Allergen Reactions  . No Known Allergies     Medication list has been reviewed and updated.  Current  Outpatient Medications on File Prior to Visit  Medication Sig Dispense Refill  . Calcium Carbonate (CALCIUM 600 PO) Take 1 tablet by mouth daily.    . mirabegron ER (MYRBETRIQ) 25 MG TB24 tablet Take 25 mg by mouth daily.    Marland Kitchen tobramycin-dexamethasone (TOBRADEX) ophthalmic ointment 1 application 3 (three) times daily.     No current facility-administered medications on file prior to visit.    Review of Systems:  As per HPI- otherwise negative.   Physical Examination: Vitals:   04/14/19 1259  BP: 102/68  Pulse: 65  Resp: 16  Temp: 97.7 F (36.5 C)  SpO2: 97%   Vitals:   04/14/19 1259  Weight: 135 lb (61.2 kg)  Height: 5\' 2"  (1.575 m)   Body mass index is 24.69 kg/m. Ideal Body Weight: Weight in (lb) to have BMI = 25: 136.4  GEN: WDWN, NAD, Non-toxic, A & O x 3, normal weight, looks well HEENT: Atraumatic, Normocephalic. Neck supple. No masses, No LAD.  Bilateral TM wnl, oropharynx normal.  PEERL,EOMI. trace swelling of the right upper lid, perhaps consistent with resolving stye Ears and Nose: No external deformity. CV: RRR, No M/G/R. No JVD. No thrill. No extra heart sounds. PULM: CTA B, no wheezes, crackles, rhonchi. No retractions. No resp. distress. No accessory muscle use. ABD: S, NT, ND, +BS. No rebound. No HSM. EXTR: No c/c/e NEURO Normal gait.  PSYCH: Normally  interactive. Conversant. Not depressed or anxious appearing.  Calm demeanor.    Assessment and Plan: Physical exam  Screening for hyperlipidemia - Plan: Lipid panel  Screening for thyroid disorder - Plan: TSH  Screening, deficiency anemia, iron - Plan: CBC  Screening for diabetes mellitus - Plan: Comprehensive metabolic panel, Hemoglobin A1c  Immunization due  Screening for colon cancer  Redness of right eye - Plan: tobramycin (TOBREX) 0.3 % ophthalmic solution  Depression, major, single episode, mild ([place]) - Plan: escitalopram (LEXAPRO) 10 MG tablet  Needs flu shot - Plan: Flu Vaccine QUAD  6+ mos PF IM (Fluarix Quad PF)  Following up today for a CPE Labs pending as above Flu vaccine today  Ordered cologuard  Will plan further follow- up pending labs. I gave her plain tobramycin, explained that I prefer not to use ophthalmic steroids without ophthalmology supervision.  She states understanding  This visit occurred during the SARS-CoV-2 public health emergency.  Safety protocols were in place, including screening questions prior to the visit, additional usage of staff PPE, and extensive cleaning of exam room while observing appropriate contact time as indicated for disinfecting solutions.    Signed Lamar Blinks, MD   Received her labs, message to patient  Results for orders placed or performed in visit on 04/14/19  CBC  Result Value Ref Range   WBC 2.8 (L) 4.0 - 10.5 K/uL   RBC 4.12 3.87 - 5.11 Mil/uL   Platelets 255.0 150.0 - 400.0 K/uL   Hemoglobin 12.3 12.0 - 15.0 g/dL   HCT 36.5 36.0 - 46.0 %   MCV 88.7 78.0 - 100.0 fl   MCHC 33.7 30.0 - 36.0 g/dL   RDW 12.5 11.5 - 15.5 %  Comprehensive metabolic panel  Result Value Ref Range   Sodium 134 (L) 135 - 145 mEq/L   Potassium 3.9 3.5 - 5.1 mEq/L   Chloride 99 96 - 112 mEq/L   CO2 27 19 - 32 mEq/L   Glucose, Bld 91 70 - 99 mg/dL   BUN 14 6 - 23 mg/dL   Creatinine, Ser 0.66 0.40 - 1.20 mg/dL   Total Bilirubin 0.7 0.2 - 1.2 mg/dL   Alkaline Phosphatase 41 39 - 117 U/L   AST 21 0 - 37 U/L   ALT 17 0 - 35 U/L   Total Protein 6.6 6.0 - 8.3 g/dL   Albumin 4.5 3.5 - 5.2 g/dL   GFR 93.26 >60.00 mL/min   Calcium 9.4 8.4 - 10.5 mg/dL  Hemoglobin A1c  Result Value Ref Range   Hgb A1c MFr Bld 5.7 4.6 - 6.5 %  Lipid panel  Result Value Ref Range   Cholesterol 166 0 - 200 mg/dL   Triglycerides 61.0 0.0 - 149.0 mg/dL   HDL 66.00 >39.00 mg/dL   VLDL 12.2 0.0 - 40.0 mg/dL   LDL Cholesterol 88 0 - 99 mg/dL   Total CHOL/HDL Ratio 3    NonHDL 100.21   TSH  Result Value Ref Range   TSH 1.06 0.35 - 4.50 uIU/mL

## 2019-04-12 NOTE — Patient Instructions (Signed)
It was nice to see you again today, I will be in touch with your labs as soon as possible Once I have your labs I can fax your physical information to [place] for you Please complete Cologuard asap Think about Shingrix vaccine  Take care!    Health Maintenance, Female Adopting a healthy lifestyle and getting preventive care are important in promoting health and wellness. Ask your health care provider about:  The right schedule for you to have regular tests and exams.  Things you can do on your own to prevent diseases and keep yourself healthy. What should I know about diet, weight, and exercise? Eat a healthy diet   Eat a diet that includes plenty of vegetables, fruits, low-fat dairy products, and lean protein.  Do not eat a lot of foods that are high in solid fats, added sugars, or sodium. Maintain a healthy weight Body mass index (BMI) is used to identify weight problems. It estimates body fat based on height and weight. Your health care provider can help determine your BMI and help you achieve or maintain a healthy weight. Get regular exercise Get regular exercise. This is one of the most important things you can do for your health. Most adults should:  Exercise for at least 150 minutes each week. The exercise should increase your heart rate and make you sweat (moderate-intensity exercise).  Do strengthening exercises at least twice a week. This is in addition to the moderate-intensity exercise.  Spend less time sitting. Even light physical activity can be beneficial. Watch cholesterol and blood lipids Have your blood tested for lipids and cholesterol at 55 years of age, then have this test every 5 years. Have your cholesterol levels checked more often if:  Your lipid or cholesterol levels are high.  You are older than 55 years of age.  You are at high risk for heart disease. What should I know about cancer screening? Depending on your health history and family  history, you may need to have cancer screening at various ages. This may include screening for:  Breast cancer.  Cervical cancer.  Colorectal cancer.  Skin cancer.  Lung cancer. What should I know about heart disease, diabetes, and high blood pressure? Blood pressure and heart disease  High blood pressure causes heart disease and increases the risk of stroke. This is more likely to develop in people who have high blood pressure readings, are of African descent, or are overweight.  Have your blood pressure checked: ? Every 3-5 years if you are 19-72 years of age. ? Every year if you are 69 years old or older. Diabetes Have regular diabetes screenings. This checks your fasting blood sugar level. Have the screening done:  Once every three years after age 63 if you are at a normal weight and have a low risk for diabetes.  More often and at a younger age if you are overweight or have a high risk for diabetes. What should I know about preventing infection? Hepatitis B If you have a higher risk for hepatitis B, you should be screened for this virus. Talk with your health care provider to find out if you are at risk for hepatitis B infection. Hepatitis C Testing is recommended for:  Everyone born from [birth year] through 1966.  Anyone with known risk factors for hepatitis C. Sexually transmitted infections (STIs)  Get screened for STIs, including gonorrhea and chlamydia, if: ? You are sexually active and are younger than 55 years of age. ? You are  older than 55 years of age and your health care provider tells you that you are at risk for this type of infection. ? Your sexual activity has changed since you were last screened, and you are at increased risk for chlamydia or gonorrhea. Ask your health care provider if you are at risk.  Ask your health care provider about whether you are at high risk for HIV. Your health care provider may recommend a prescription medicine to help prevent HIV  infection. If you choose to take medicine to prevent HIV, you should first get tested for HIV. You should then be tested every 3 months for as long as you are taking the medicine. Pregnancy  If you are about to stop having your period (premenopausal) and you may become pregnant, seek counseling before you get pregnant.  Take 400 to 800 micrograms (mcg) of folic acid every day if you become pregnant.  Ask for birth control (contraception) if you want to prevent pregnancy. Osteoporosis and menopause Osteoporosis is a disease in which the bones lose minerals and strength with aging. This can result in bone fractures. If you are 55 years old or older, or if you are at risk for osteoporosis and fractures, ask your health care provider if you should:  Be screened for bone loss.  Take a calcium or vitamin D supplement to lower your risk of fractures.  Be given hormone replacement therapy (HRT) to treat symptoms of menopause. Follow these instructions at home: Lifestyle  Do not use any products that contain nicotine or tobacco, such as cigarettes, e-cigarettes, and chewing tobacco. If you need help quitting, ask your health care provider.  Do not use street drugs.  Do not share needles.  Ask your health care provider for help if you need support or information about quitting drugs. Alcohol use  Do not drink alcohol if: ? Your health care provider tells you not to drink. ? You are pregnant, may be pregnant, or are planning to become pregnant.  If you drink alcohol: ? Limit how much you use to 0-1 drink a day. ? Limit intake if you are breastfeeding.  Be aware of how much alcohol is in your drink. In the [place] one drink equals one 12 oz bottle of beer (355 mL), one 5 oz glass of wine (148 mL), or one 1 oz glass of hard liquor (44 mL). General instructions  Schedule regular health, dental, and eye exams.  Stay current with your vaccines.  Tell your health care provider if: ? You  often feel depressed. ? You have ever been abused or do not feel safe at home. Summary  Adopting a healthy lifestyle and getting preventive care are important in promoting health and wellness.  Follow your health care provider's instructions about healthy diet, exercising, and getting tested or screened for diseases.  Follow your health care provider's instructions on monitoring your cholesterol and blood pressure. This information is not intended to replace advice given to you by your health care provider. Make sure you discuss any questions you have with your health care provider. Document Revised: 02/18/2018 Document Reviewed: 02/18/2018 Elsevier Patient Education  2020 [employer].

## 2019-04-14 ENCOUNTER — Encounter: Payer: Self-pay | Admitting: Family Medicine

## 2019-04-14 ENCOUNTER — Ambulatory Visit (INDEPENDENT_AMBULATORY_CARE_PROVIDER_SITE_OTHER): Payer: BC Managed Care – PPO | Admitting: Family Medicine

## 2019-04-14 ENCOUNTER — Other Ambulatory Visit: Payer: Self-pay

## 2019-04-14 ENCOUNTER — Telehealth: Payer: Self-pay

## 2019-04-14 VITALS — BP 102/68 | HR 65 | Temp 97.7°F | Resp 16 | Ht 62.0 in | Wt 135.0 lb

## 2019-04-14 DIAGNOSIS — Z0000 Encounter for general adult medical examination without abnormal findings: Secondary | ICD-10-CM | POA: Diagnosis not present

## 2019-04-14 DIAGNOSIS — Z23 Encounter for immunization: Secondary | ICD-10-CM | POA: Diagnosis not present

## 2019-04-14 DIAGNOSIS — H5789 Other specified disorders of eye and adnexa: Secondary | ICD-10-CM

## 2019-04-14 DIAGNOSIS — F320 Major depressive disorder, single episode, mild: Secondary | ICD-10-CM

## 2019-04-14 DIAGNOSIS — Z13220 Encounter for screening for lipoid disorders: Secondary | ICD-10-CM

## 2019-04-14 DIAGNOSIS — Z1329 Encounter for screening for other suspected endocrine disorder: Secondary | ICD-10-CM

## 2019-04-14 DIAGNOSIS — Z1211 Encounter for screening for malignant neoplasm of colon: Secondary | ICD-10-CM

## 2019-04-14 DIAGNOSIS — Z130 Encounter for screening for diseases of the blood and blood-forming organs and certain disorders involving the immune mechanism: Secondary | ICD-10-CM

## 2019-04-14 DIAGNOSIS — Z131 Encounter for screening for diabetes mellitus: Secondary | ICD-10-CM

## 2019-04-14 DIAGNOSIS — D72819 Decreased white blood cell count, unspecified: Secondary | ICD-10-CM

## 2019-04-14 LAB — LIPID PANEL
Cholesterol: 166 mg/dL (ref 0–200)
HDL: 66 mg/dL (ref >39.00)
LDL Cholesterol: 88 mg/dL (ref 0–99)
NonHDL: 100.21
Total CHOL/HDL Ratio: 3
Triglycerides: 61 mg/dL (ref 0.0–149.0)
VLDL: 12.2 mg/dL (ref 0.0–40.0)

## 2019-04-14 LAB — CBC
HCT: 36.5 % (ref 36.0–46.0)
Hemoglobin: 12.3 g/dL (ref 12.0–15.0)
MCHC: 33.7 g/dL (ref 30.0–36.0)
MCV: 88.7 fl (ref 78.0–100.0)
Platelets: 255 10*3/uL (ref 150.0–400.0)
RBC: 4.12 Mil/uL (ref 3.87–5.11)
RDW: 12.5 % (ref 11.5–15.5)
WBC: 2.8 10*3/uL — ABNORMAL LOW (ref 4.0–10.5)

## 2019-04-14 LAB — COMPREHENSIVE METABOLIC PANEL
ALT: 17 U/L (ref 0–35)
AST: 21 U/L (ref 0–37)
Albumin: 4.5 g/dL (ref 3.5–5.2)
Alkaline Phosphatase: 41 U/L (ref 39–117)
BUN: 14 mg/dL (ref 6–23)
CO2: 27 mEq/L (ref 19–32)
Calcium: 9.4 mg/dL (ref 8.4–10.5)
Chloride: 99 mEq/L (ref 96–112)
Creatinine, Ser: 0.66 mg/dL (ref 0.40–1.20)
GFR: 93.26 mL/min (ref >60.00)
Glucose, Bld: 91 mg/dL (ref 70–99)
Potassium: 3.9 mEq/L (ref 3.5–5.1)
Sodium: 134 mEq/L — ABNORMAL LOW (ref 135–145)
Total Bilirubin: 0.7 mg/dL (ref 0.2–1.2)
Total Protein: 6.6 g/dL (ref 6.0–8.3)

## 2019-04-14 LAB — TSH: TSH: 1.06 u[IU]/mL (ref 0.35–4.50)

## 2019-04-14 LAB — HEMOGLOBIN A1C: Hgb A1c MFr Bld: 5.7 % (ref 4.6–6.5)

## 2019-04-14 MED ORDER — TOBRAMYCIN 0.3 % OP SOLN: 1.0000 [drp] | OPHTHALMIC | 0 refills | Status: DC

## 2019-04-14 MED ORDER — ESCITALOPRAM OXALATE 10 MG PO TABS: 10.0000 mg | ORAL_TABLET | Freq: Every day | ORAL | 3 refills | Status: DC

## 2019-04-14 NOTE — Telephone Encounter (Signed)
Cologuard ordered through [employer] portal.

## 2019-04-14 NOTE — Addendum Note (Signed)
Addended by: Lamar Blinks C on: 04/14/2019 06:55 PM   Modules accepted: Orders

## 2019-05-19 ENCOUNTER — Encounter: Payer: Self-pay | Admitting: Family Medicine

## 2019-05-19 DIAGNOSIS — Z1211 Encounter for screening for malignant neoplasm of colon: Secondary | ICD-10-CM | POA: Diagnosis not present

## 2019-05-20 LAB — COLOGUARD

## 2019-05-24 ENCOUNTER — Other Ambulatory Visit: Payer: Self-pay | Admitting: Family Medicine

## 2019-05-24 ENCOUNTER — Encounter: Payer: Self-pay | Admitting: Family Medicine

## 2019-05-24 DIAGNOSIS — R195 Other fecal abnormalities: Secondary | ICD-10-CM

## 2019-05-24 NOTE — Progress Notes (Signed)
[place] patient and left message on her voicemail.  Cologuard is positive, will refer to GI for colonoscopy.  Send her a Pharmacist, community message as well

## 2019-06-10 ENCOUNTER — Encounter: Payer: Self-pay | Admitting: Physician Assistant

## 2019-06-28 ENCOUNTER — Ambulatory Visit: Payer: BC Managed Care – PPO | Admitting: Physician Assistant

## 2019-06-28 ENCOUNTER — Encounter: Payer: Self-pay | Admitting: Physician Assistant

## 2019-06-28 VITALS — BP 106/76 | HR 78 | Temp 97.9°F | Ht 63.0 in | Wt 132.1 lb

## 2019-06-28 DIAGNOSIS — R195 Other fecal abnormalities: Secondary | ICD-10-CM

## 2019-06-28 MED ORDER — NA SULFATE-K SULFATE-MG SULF 17.5-3.13-1.6 GM/177ML PO SOLN: 1.0000 | Freq: Once | ORAL | 0 refills | Status: AC

## 2019-06-28 NOTE — Patient Instructions (Signed)
If you are age 55 or older, your body mass index should be between 23-30. Your Body mass index is 23.4 kg/m. If this is out of the aforementioned range listed, please consider follow up with your Primary Care Provider.  If you are age 64 or younger, your body mass index should be between 19-25. Your Body mass index is 23.4 kg/m. If this is out of the aformentioned range listed, please consider follow up with your Primary Care Provider.   You have been scheduled for a colonoscopy. Please follow written instructions given to you at your visit today.  Please pick up your prep supplies at the pharmacy within the next 1-3 days. If you use inhalers (even only as needed), please bring them with you on the day of your procedure.   

## 2019-06-28 NOTE — Progress Notes (Signed)
Subjective:    Patient ID: Nichole Mccarty, female    DOB: Jun 04, 1965, 54 y.o.   MRN: [id number]  HPI Nichole Mccarty is a pleasant 54 year old white female, new to GI today referred by Silvestre Mesi MD for positive Cologuard. Patient has not had any prior GI evaluation.  She currently has no GI complaints.  She has not noticed any abdominal pain, changes in bowel habits melena or hematochezia. Family history is negative for colon cancer and polyps as far she is aware.  She is generally in good health.  Review of Systems Pertinent positive and negative review of systems were noted in the above HPI section.  All other review of systems was otherwise negative.  Outpatient Encounter Medications as of 06/28/2019  Medication Sig  . Calcium Carbonate (CALCIUM 600 PO) Take 1 tablet by mouth daily.  Marland Kitchen escitalopram (LEXAPRO) 10 MG tablet Take 1 tablet (10 mg total) by mouth daily.  . mirabegron ER (MYRBETRIQ) 25 MG TB24 tablet Take 25 mg by mouth daily.  Marland Kitchen tobramycin (TOBREX) 0.3 % ophthalmic solution Place 1 drop into the right eye every 4 (four) hours. Use as needed for eye infection  . tobramycin-dexamethasone (TOBRADEX) ophthalmic ointment 1 application 3 (three) times daily.  . Na Sulfate-K Sulfate-Mg Sulf 17.5-3.13-1.6 GM/177ML SOLN Take 1 kit by mouth once for 1 dose. Apply Coupon=BIN: [id number] PCN: CN GROUP: [id number] ID: [id number]   No facility-administered encounter medications on file as of 06/28/2019.   Allergies  Allergen Reactions  . No Known Allergies    Patient Active Problem List   Diagnosis Date Noted  . Acute medial meniscal tear, left, initial encounter 07/02/2017  . Tear of MCL (medial collateral ligament) of knee, right, initial encounter 05/07/2017   Social History   Socioeconomic History  . Marital status: Single    Spouse name: Not on file  . Number of children: Not on file  . Years of education: Not on file  . Highest education level: Not on file  Occupational History    . Occupation: Sales  Tobacco Use  . Smoking status: Never Smoker  . Smokeless tobacco: Never Used  Substance and Sexual Activity  . Alcohol use: Yes  . Drug use: No  . Sexual activity: Not on file  Other Topics Concern  . Not on file  Social History Narrative  . Not on file   Social Determinants of Health   Financial Resource Strain:   . Difficulty of Paying Living Expenses:   Food Insecurity:   . Worried About Charity fundraiser in the Last Year:   . Arboriculturist in the Last Year:   Transportation Needs:   . Film/video editor (Medical):   Marland Kitchen Lack of Transportation (Non-Medical):   Physical Activity:   . Days of Exercise per Week:   . Minutes of Exercise per Session:   Stress:   . Feeling of Stress :   Social Connections:   . Frequency of Communication with Friends and Family:   . Frequency of Social Gatherings with Friends and Family:   . Attends Religious Services:   . Active Member of Clubs or Organizations:   . Attends Archivist Meetings:   Marland Kitchen Marital Status:   Intimate Partner Violence:   . Fear of Current or Ex-Partner:   . Emotionally Abused:   Marland Kitchen Physically Abused:   . Sexually Abused:     Ms. Walby family history is not on file.      Objective:  Vitals:   06/28/19 0934  BP: 106/76  Pulse: 78  Temp: 97.9 F (36.6 C)    Physical Exam Well-developed well-nourished W  female in no acute distress.  Pleasant  height, Weight 132, BMI 23.4  HEENT; nontraumatic normocephalic, EOMI, PER R LA, sclera anicteric. Oropharynx; not examined Neck; supple, no JVD Cardiovascular; regular rate and rhythm with S1-S2, no murmur rub or gallop Pulmonary; Clear bilaterally Abdomen; soft, nontender, nondistended, no palpable mass or hepatosplenomegaly, bowel sounds are active Rectal; not done Skin; benign exam, no jaundice rash or appreciable lesions Extremities; no clubbing cyanosis or edema skin warm and dry Neuro/Psych; alert and oriented  x4, grossly nonfocal mood and affect appropriate       Assessment & Plan:   #1 54-year-old white female with positive Cologuard stool DNA test.  Patient is currently asymptomatic  #2 history of anxiety  Plan; Patient will be scheduled for Colonoscopy with Dr. Jacobs.  The procedure was discussed in detail with the patient including indications risks and benefits and she is agreeable to proceed. Patient has been vaccinated for COVID-19.  Amy S Esterwood PA-C 06/28/2019   Cc: Copland, Jessica C, MD  

## 2019-06-28 NOTE — Progress Notes (Signed)
I agree with the above note, plan 

## 2019-07-15 ENCOUNTER — Encounter: Payer: Self-pay | Admitting: Family Medicine

## 2019-07-15 DIAGNOSIS — N898 Other specified noninflammatory disorders of vagina: Secondary | ICD-10-CM

## 2019-07-16 MED ORDER — ESTRADIOL 10 MCG VA TABS: 1.0000 | ORAL_TABLET | VAGINAL | 3 refills | Status: DC

## 2019-07-16 NOTE — Addendum Note (Signed)
Addended by: Lamar Blinks C on: 07/16/2019 05:36 AM   Modules accepted: Orders

## 2019-07-21 MED ORDER — ESTRADIOL 10 MCG VA TABS: 1.0000 | ORAL_TABLET | VAGINAL | 3 refills | Status: DC

## 2019-07-21 NOTE — Addendum Note (Signed)
Addended by: Lamar Blinks C on: 07/21/2019 09:20 AM   Modules accepted: Orders

## 2019-08-11 ENCOUNTER — Encounter: Payer: Self-pay | Admitting: Gastroenterology

## 2019-08-23 ENCOUNTER — Encounter: Payer: Self-pay | Admitting: Family Medicine

## 2019-08-23 ENCOUNTER — Telehealth: Payer: Self-pay

## 2019-08-23 DIAGNOSIS — J440 Chronic obstructive pulmonary disease with acute lower respiratory infection: Secondary | ICD-10-CM

## 2019-08-23 NOTE — Telephone Encounter (Signed)
Patient called in to speak with the nurse about her strap throat. Please give the patient a call back at [phone] to advise.

## 2019-08-23 NOTE — Telephone Encounter (Signed)
Left message to return call 

## 2019-08-25 ENCOUNTER — Telehealth: Payer: BC Managed Care – PPO | Admitting: Medical

## 2019-08-25 ENCOUNTER — Other Ambulatory Visit: Payer: Self-pay

## 2019-08-25 ENCOUNTER — Encounter: Payer: Self-pay | Admitting: Family Medicine

## 2019-08-25 MED ORDER — DOXYCYCLINE HYCLATE 100 MG PO TABS: 100.0000 mg | ORAL_TABLET | Freq: Two times a day (BID) | ORAL | 0 refills | Status: DC

## 2019-08-25 NOTE — Addendum Note (Signed)
Addended by: Lamar Blinks C on: 08/25/2019 01:04 PM   Modules accepted: Orders

## 2019-08-31 ENCOUNTER — Encounter: Payer: Self-pay | Admitting: Family Medicine

## 2019-09-14 DIAGNOSIS — R6882 Decreased libido: Secondary | ICD-10-CM | POA: Diagnosis not present

## 2019-09-14 DIAGNOSIS — R232 Flushing: Secondary | ICD-10-CM | POA: Diagnosis not present

## 2019-09-14 DIAGNOSIS — N951 Menopausal and female climacteric states: Secondary | ICD-10-CM | POA: Diagnosis not present

## 2019-09-14 DIAGNOSIS — N898 Other specified noninflammatory disorders of vagina: Secondary | ICD-10-CM | POA: Diagnosis not present

## 2019-09-16 DIAGNOSIS — N951 Menopausal and female climacteric states: Secondary | ICD-10-CM | POA: Diagnosis not present

## 2019-09-16 DIAGNOSIS — R232 Flushing: Secondary | ICD-10-CM | POA: Diagnosis not present

## 2019-09-16 NOTE — Progress Notes (Deleted)
[place] at [facility] [address] [place], [place] [address] [phone]  Date:  09/20/2019   Name:  Nichole Mccarty   DOB:  Apr 30, 1965   MRN:  [id number]  PCP:  Darreld Mclean, MD    Chief Complaint: No chief complaint on file.   History of Present Illness:  Nichole Mccarty is a 54 y.o. very pleasant female patient who presents with the following:  Generally healthy patient presents today with concern of illness [facility] with concern of sore throat Throat culture pending, she was given a Decadron injection and swish and spit lidocaine [facility] and had a negative rapid strep swab  I then called her in some doxycycline on June 16 for concern of continued symptoms with apparently negative strep The patient contacted me as she was still not feeling great.  She has been Covid vaccinated, I asked her to be Covid tested so that we could bring her into the office  Most recent routine labs in February Patient Active Problem List   Diagnosis Date Noted  . Acute medial meniscal tear, left, initial encounter 07/02/2017  . Tear of MCL (medial collateral ligament) of knee, right, initial encounter 05/07/2017    Past Medical History:  Diagnosis Date  . Anxiety     Past Surgical History:  Procedure Laterality Date  . WISDOM TOOTH EXTRACTION      Social History   Tobacco Use  . Smoking status: Never Smoker  . Smokeless tobacco: Never Used  Vaping Use  . Vaping Use: Never used  Substance Use Topics  . Alcohol use: Yes  . Drug use: No    Family History  Problem Relation Age of Onset  . Colon cancer Neg Hx   . Esophageal cancer Neg Hx     Allergies  Allergen Reactions  . No Known Allergies     Medication list has been reviewed and updated.  Current Outpatient Medications on File Prior to Visit  Medication Sig Dispense Refill  . Calcium  Carbonate (CALCIUM 600 PO) Take 1 tablet by mouth daily.    Marland Kitchen doxycycline (VIBRA-TABS) 100 MG tablet Take 1 tablet (100 mg total) by mouth 2 (two) times daily. 20 tablet 0  . escitalopram (LEXAPRO) 10 MG tablet Take 1 tablet (10 mg total) by mouth daily. 90 tablet 3  . Estradiol 10 MCG TABS vaginal tablet Place 1 tablet (10 mcg total) vaginally 2 (two) times a week. 8 tablet 3  . mirabegron ER (MYRBETRIQ) 25 MG TB24 tablet Take 25 mg by mouth daily.    Marland Kitchen tobramycin (TOBREX) 0.3 % ophthalmic solution Place 1 drop into the right eye every 4 (four) hours. Use as needed for eye infection 5 mL 0  . tobramycin-dexamethasone (TOBRADEX) ophthalmic ointment 1 application 3 (three) times daily.     No current facility-administered medications on file prior to visit.    Review of Systems:  As per HPI- otherwise negative.   Physical Examination: There were no vitals filed for this visit. There were no vitals filed for this visit. There is no height or weight on file to calculate BMI. Ideal Body Weight:    GEN: no acute distress. HEENT: Atraumatic, Normocephalic.  Ears and Nose: No external deformity. CV: RRR, No M/G/R. No JVD. No thrill. No extra heart sounds. PULM: CTA B, no wheezes, crackles,  rhonchi. No retractions. No resp. distress. No accessory muscle use. ABD: S, NT, ND, +BS. No rebound. No HSM. EXTR: No c/c/e PSYCH: Normally interactive. Conversant.    Assessment and Plan: *** This visit occurred during the SARS-CoV-2 public health emergency.  Safety protocols were in place, including screening questions prior to the visit, additional usage of staff PPE, and extensive cleaning of exam room while observing appropriate contact time as indicated for disinfecting solutions.    Signed Lamar Blinks, MD

## 2019-09-20 ENCOUNTER — Ambulatory Visit: Payer: BC Managed Care – PPO | Admitting: Family Medicine

## 2019-10-03 ENCOUNTER — Other Ambulatory Visit: Payer: Self-pay | Admitting: Family Medicine

## 2019-10-03 DIAGNOSIS — F320 Major depressive disorder, single episode, mild: Secondary | ICD-10-CM

## 2019-11-16 DIAGNOSIS — R232 Flushing: Secondary | ICD-10-CM | POA: Diagnosis not present

## 2019-11-16 DIAGNOSIS — N951 Menopausal and female climacteric states: Secondary | ICD-10-CM | POA: Diagnosis not present

## 2019-11-18 DIAGNOSIS — F3342 Major depressive disorder, recurrent, in full remission: Secondary | ICD-10-CM | POA: Diagnosis not present

## 2019-11-18 DIAGNOSIS — N951 Menopausal and female climacteric states: Secondary | ICD-10-CM | POA: Diagnosis not present

## 2019-11-18 DIAGNOSIS — R232 Flushing: Secondary | ICD-10-CM | POA: Diagnosis not present

## 2019-12-29 NOTE — Progress Notes (Signed)
[place] at [facility] [address], [place], [place] [address] [phone]  Date:  12/30/2019   Name:  Nichole Mccarty   DOB:  September 13, 1965   MRN:  [id number]  PCP:  Darreld Mclean, MD    Chief Complaint: Pap Smear, Flu Vaccine, and Herpes Zoster   History of Present Illness:  Nichole Mccarty is a 54 y.o. very pleasant female patient who presents with the following:  Generally healthy woman here today for follow-up visit Last seen by myself in February of this year   Colon cancer screening-  She had a positive cologuard but did not end up having a colonoscopy locally because she felt like the exam was presented as too much of a hassle She plans to see the GI that her daughter saw in [place] instead-I did encourage her to attend to this as soon as possible, as there is a small but definite risk of colon cancer Flu vaccine- update today  Pap is now due- no abnormal's in the last several years at least, she may have had a distant abnormal years ago COVID-19 vaccine- she had her primary series already.  She feels like she may have had covid over this past summer Mammogram up-to-date Most recent lab work in February of this year-at that time white cell count was low, A1c 5.7-repeat is today shingirx- she is interested in getting this done.  We discussed this today- she would like to defer for now due to possible side effects  She has had her IUD removed and is currently doing some sort of hormone replacement therapy involving testosterone and progesterone pellets per "[facility]"  Patient Active Problem List   Diagnosis Date Noted  . Acute medial meniscal tear, left, initial encounter 07/02/2017  . Tear of MCL (medial collateral ligament) of knee, right, initial encounter 05/07/2017    Past Medical History:  Diagnosis Date  . Anxiety     Past Surgical History:  Procedure Laterality Date  . WISDOM TOOTH EXTRACTION       Social History   Tobacco Use  . Smoking status: Never Smoker  . Smokeless tobacco: Never Used  Vaping Use  . Vaping Use: Never used  Substance Use Topics  . Alcohol use: Yes  . Drug use: No    Family History  Problem Relation Age of Onset  . Colon cancer Neg Hx   . Esophageal cancer Neg Hx     Allergies  Allergen Reactions  . No Known Allergies     Medication list has been reviewed and updated.  Current Outpatient Medications on File Prior to Visit  Medication Sig Dispense Refill  . Calcium Carbonate (CALCIUM 600 PO) Take 1 tablet by mouth daily.    Marland Kitchen escitalopram (LEXAPRO) 10 MG tablet TAKE 1 TABLET DAILY 90 tablet 1  . mirabegron ER (MYRBETRIQ) 25 MG TB24 tablet Take 25 mg by mouth daily.    . progesterone (PROMETRIUM) 100 MG capsule TAKE 1 CAPSULE BY MOUTH BEFORE BEDTIME AS DIRECTED    . tobramycin (TOBREX) 0.3 % ophthalmic solution Place 1 drop into the right eye every 4 (four) hours. Use as needed for eye infection (Patient not taking: Reported on 12/30/2019) 5 mL 0  . tobramycin-dexamethasone (TOBRADEX) ophthalmic ointment 1 application 3 (three) times daily. (Patient not taking: Reported on 12/30/2019)     No current facility-administered medications on file prior to visit.    Review of Systems:  As  per HPI- otherwise negative. She gets plenty of exercise BP Readings from Last 3 Encounters:  12/30/19 100/68  06/28/19 106/76  04/14/19 102/68     Physical Examination: Vitals:   12/30/19 1527  BP: 100/68  Pulse: (!) 53  Resp: 18  Temp: 98.3 F (36.8 C)  SpO2: 97%   Vitals:   12/30/19 1527  Weight: 133 lb 6.4 oz (60.5 kg)  Height: 5' 2.6" (1.59 m)   Body mass index is 23.94 kg/m. Ideal Body Weight: Weight in (lb) to have BMI = 25: 139  GEN: no acute distress.  Fit build, looks well HEENT: Atraumatic, Normocephalic.  Ears and Nose: No external deformity. CV: RRR, No M/G/R. No JVD. No thrill. No extra heart sounds. PULM: CTA B, no wheezes,  crackles, rhonchi. No retractions. No resp. distress. No accessory muscle use. ABD: S, NT, ND, +BS. No rebound. No HSM. EXTR: No c/c/e PSYCH: Normally interactive. Conversant.  Pap smear collected today.  Normal vulva, vagina, cervix, adnexa  Assessment and Plan: Screening for cervical cancer - Plan: Cytology - PAP  Leukopenia, unspecified type - Plan: CBC  Screening for diabetes mellitus - Plan: Hemoglobin A1c  Patient today for a Pap smear-collected and pending Updated flu vaccine Encouraged her to follow-up on positive Cologuard a soon as possible We will follow-up on leukopenia and slightly elevated A1c Will plan further follow- up pending labs.  This visit occurred during the SARS-CoV-2 public health emergency.  Safety protocols were in place, including screening questions prior to the visit, additional usage of staff PPE, and extensive cleaning of exam room while observing appropriate contact time as indicated for disinfecting solutions.    Signed Lamar Blinks, MD

## 2019-12-29 NOTE — Patient Instructions (Addendum)
It was great to see you again today, I will be in touch with your labs as soon as possible You got your flu shot today Please get your shingles vaccine when it is convenient for you Please do address your colon cancer screening asap- there is a small but real chance that you could have cancer- much better if caught early!   We will also recheck your white cell count and average blood sugar today

## 2019-12-30 ENCOUNTER — Other Ambulatory Visit: Payer: Self-pay

## 2019-12-30 ENCOUNTER — Encounter: Payer: Self-pay | Admitting: Family Medicine

## 2019-12-30 ENCOUNTER — Ambulatory Visit: Payer: BC Managed Care – PPO | Admitting: Family Medicine

## 2019-12-30 ENCOUNTER — Other Ambulatory Visit (HOSPITAL_COMMUNITY)
Admission: RE | Admit: 2019-12-30 | Discharge: 2019-12-30 | Disposition: A | Discharge status: Home / Self Care | Payer: BC Managed Care – PPO | Source: Ambulatory Visit | Attending: Family Medicine | Admitting: Family Medicine

## 2019-12-30 VITALS — BP 100/68 | HR 53 | Temp 98.3°F | Resp 18 | Ht 62.6 in | Wt 133.4 lb

## 2019-12-30 DIAGNOSIS — Z124 Encounter for screening for malignant neoplasm of cervix: Secondary | ICD-10-CM

## 2019-12-30 DIAGNOSIS — Z23 Encounter for immunization: Secondary | ICD-10-CM | POA: Diagnosis not present

## 2019-12-30 DIAGNOSIS — D72819 Decreased white blood cell count, unspecified: Secondary | ICD-10-CM

## 2019-12-30 DIAGNOSIS — Z131 Encounter for screening for diabetes mellitus: Secondary | ICD-10-CM

## 2019-12-30 DIAGNOSIS — Z114 Encounter for screening for human immunodeficiency virus [HIV]: Secondary | ICD-10-CM

## 2019-12-30 DIAGNOSIS — Z1159 Encounter for screening for other viral diseases: Secondary | ICD-10-CM

## 2019-12-31 ENCOUNTER — Encounter: Payer: Self-pay | Admitting: Family Medicine

## 2019-12-31 LAB — HEMOGLOBIN A1C
Hgb A1c MFr Bld: 5.4 % of total Hgb (ref <5.7)
Mean Plasma Glucose: 108 (calc)
eAG (mmol/L): 6 (calc)

## 2019-12-31 LAB — CBC
HCT: 36.3 % (ref 35.0–45.0)
Hemoglobin: 12 g/dL (ref 11.7–15.5)
MCH: 30.5 pg (ref 27.0–33.0)
MCHC: 33.1 g/dL (ref 32.0–36.0)
MCV: 92.4 fL (ref 80.0–100.0)
MPV: 10.3 fL (ref 7.5–12.5)
Platelets: 273 10*3/uL (ref 140–400)
RBC: 3.93 10*6/uL (ref 3.80–5.10)
RDW: 11.8 % (ref 11.0–15.0)
WBC: 3.7 10*3/uL — ABNORMAL LOW (ref 3.8–10.8)

## 2020-01-03 ENCOUNTER — Encounter: Payer: Self-pay | Admitting: Family Medicine

## 2020-01-03 LAB — CYTOLOGY - PAP
Comment: NEGATIVE
Diagnosis: NEGATIVE
High risk HPV: NEGATIVE

## 2020-01-14 ENCOUNTER — Encounter: Payer: Self-pay | Admitting: Family Medicine

## 2020-01-14 DIAGNOSIS — M79671 Pain in right foot: Secondary | ICD-10-CM

## 2020-02-10 ENCOUNTER — Other Ambulatory Visit: Payer: Self-pay

## 2020-02-10 ENCOUNTER — Encounter: Payer: Self-pay | Admitting: Family Medicine

## 2020-02-10 ENCOUNTER — Ambulatory Visit: Payer: BC Managed Care – PPO | Admitting: Family Medicine

## 2020-02-10 DIAGNOSIS — M216X9 Other acquired deformities of unspecified foot: Secondary | ICD-10-CM | POA: Diagnosis not present

## 2020-02-10 NOTE — Patient Instructions (Signed)
Good to see you  hoka or oofos recovery sandals Exercises given spenco orthotic total support for daily or at least for running shoes Vit D 2000-4000units daily See me again in 6-7 weeks

## 2020-02-10 NOTE — Assessment & Plan Note (Signed)
Significant breakdown the transverse arch bilaterally.  Mild bunion and bunionette formation noted as well.  Discussed with patient in great length about icing regimen, home medications.  Over-the-counter orthotics.  May need custom orthotics in the long run.  We discussed avoiding being barefoot in the house.  Follow-up again 6 weeks

## 2020-02-10 NOTE — Progress Notes (Signed)
[facility] [place] Phone: [phone] Subjective:   Fontaine No, am serving as a scribe for Dr. Hulan Saas. This visit occurred during the SARS-CoV-2 public health emergency.  Safety protocols were in place, including screening questions prior to the visit, additional usage of staff PPE, and extensive cleaning of exam room while observing appropriate contact time as indicated for disinfecting solutions.   I'm seeing this patient by the request  of:  Copland, Gay Filler, MD  CC:   EUM:[id number]  Artelia A Mooradian is a 54 y.o. female coming in with complaint of foot pain. Last seen in 2019 for left knee medial meniscal tear. Patient states that both feet ache after a long walk or run. Using [facility] for exercise. Occasionally has to use Advil for pain. Mentions that she has flat feet.      Past Medical History:  Diagnosis Date  . Anxiety    Past Surgical History:  Procedure Laterality Date  . WISDOM TOOTH EXTRACTION     Social History   Socioeconomic History  . Marital status: Single    Spouse name: Not on file  . Number of children: Not on file  . Years of education: Not on file  . Highest education level: Not on file  Occupational History  . Occupation: Sales  Tobacco Use  . Smoking status: Never Smoker  . Smokeless tobacco: Never Used  Vaping Use  . Vaping Use: Never used  Substance and Sexual Activity  . Alcohol use: Yes  . Drug use: No  . Sexual activity: Not on file  Other Topics Concern  . Not on file  Social History Narrative  . Not on file   Social Determinants of Health   Financial Resource Strain:   . Difficulty of Paying Living Expenses: Not on file  Food Insecurity:   . Worried About Charity fundraiser in the Last Year: Not on file  . Ran Out of Food in the Last Year: Not on file  Transportation Needs:   . Lack of Transportation (Medical): Not on file  . Lack of Transportation  (Non-Medical): Not on file  Physical Activity:   . Days of Exercise per Week: Not on file  . Minutes of Exercise per Session: Not on file  Stress:   . Feeling of Stress : Not on file  Social Connections:   . Frequency of Communication with Friends and Family: Not on file  . Frequency of Social Gatherings with Friends and Family: Not on file  . Attends Religious Services: Not on file  . Active Member of Clubs or Organizations: Not on file  . Attends Archivist Meetings: Not on file  . Marital Status: Not on file   Allergies  Allergen Reactions  . No Known Allergies    Family History  Problem Relation Age of Onset  . Colon cancer Neg Hx   . Esophageal cancer Neg Hx     Current Outpatient Medications (Endocrine & Metabolic):  .  progesterone (PROMETRIUM) 100 MG capsule, TAKE 1 CAPSULE BY MOUTH BEFORE BEDTIME AS DIRECTED      Current Outpatient Medications (Other):  Marland Kitchen  Calcium Carbonate (CALCIUM 600 PO), Take 1 tablet by mouth daily. Marland Kitchen  escitalopram (LEXAPRO) 10 MG tablet, TAKE 1 TABLET DAILY .  mirabegron ER (MYRBETRIQ) 25 MG TB24 tablet, Take 25 mg by mouth daily. Marland Kitchen  tobramycin (TOBREX) 0.3 % ophthalmic solution, Place 1 drop into the right  eye every 4 (four) hours. Use as needed for eye infection .  tobramycin-dexamethasone (TOBRADEX) ophthalmic ointment, 1 application 3 (three) times daily.    Reviewed prior external information including notes and imaging from  primary care provider As well as notes that were available from care everywhere and other healthcare systems.  Past medical history, social, surgical and family history all reviewed in electronic medical record.  No pertanent information unless stated regarding to the chief complaint.   Review of Systems:  No headache, visual changes, nausea, vomiting, diarrhea, constipation, dizziness, abdominal pain, skin rash, fevers, chills, night sweats, weight loss, swollen lymph nodes, body aches, joint swelling,  chest pain, shortness of breath, mood changes. POSITIVE muscle aches  Objective  Blood pressure 116/76, pulse (!) 57, height 5\' 2"  (1.575 m), weight 137 lb (62.1 kg), SpO2 97 %.   General: No apparent distress alert and oriented x3 mood and affect normal, dressed appropriately.  HEENT: Pupils equal, extraocular movements intact  Respiratory: Patient's speak in full sentences and does not appear short of breath  Cardiovascular: No lower extremity edema, non tender, no erythema  Foot exam shows the patient does have breakdown of the transverse arch bilaterally right greater than left.  Patient actually has fibular deviation of the first toe.  Bunion and bunionette formation noted right greater than left as well.  Tender to palpation over the metatarsal heads of 2 3 and 4 with mild callus formation on the plantar aspect.  Ankles otherwise unremarkable.  Negative squeeze test.  Neurovascularly intact distally.   [id number]; 15 additional minutes spent for Therapeutic exercises as stated in above notes.  This included exercises focusing on stretching, strengthening, with significant focus on eccentric aspects.   Long term goals include an improvement in range of motion, strength, endurance as well as avoiding reinjury. Patient's frequency would include in 1-2 times a day, 3-5 times a week for a duration of 6-12 weeks. Exercises for the foot include:  Stretches to help lengthen the lower leg and plantar fascia areas Theraband exercises for the lower leg and ankle to help strengthen the surrounding area- dorsiflexion, plantarflexion, inversion, eversion Massage rolling on the plantar surface of the foot with a frozen bottle, tennis ball or golf ball Towel or marble pick-ups to strengthen the plantar surface of the foot Weight bearing exercises to increase balance and overall stability    Proper technique shown and discussed handout in great detail with ATC.  All questions were discussed and answered.       Impression and Recommendations:     The above documentation has been reviewed and is accurate and complete Lyndal Pulley, DO

## 2020-02-14 DIAGNOSIS — D225 Melanocytic nevi of trunk: Secondary | ICD-10-CM | POA: Diagnosis not present

## 2020-02-14 DIAGNOSIS — Z85828 Personal history of other malignant neoplasm of skin: Secondary | ICD-10-CM | POA: Diagnosis not present

## 2020-02-14 DIAGNOSIS — L578 Other skin changes due to chronic exposure to nonionizing radiation: Secondary | ICD-10-CM | POA: Diagnosis not present

## 2020-02-14 DIAGNOSIS — L570 Actinic keratosis: Secondary | ICD-10-CM | POA: Diagnosis not present

## 2020-02-14 DIAGNOSIS — D485 Neoplasm of uncertain behavior of skin: Secondary | ICD-10-CM | POA: Diagnosis not present

## 2020-02-14 DIAGNOSIS — L814 Other melanin hyperpigmentation: Secondary | ICD-10-CM | POA: Diagnosis not present

## 2020-02-17 DIAGNOSIS — N951 Menopausal and female climacteric states: Secondary | ICD-10-CM | POA: Diagnosis not present

## 2020-02-22 DIAGNOSIS — N951 Menopausal and female climacteric states: Secondary | ICD-10-CM | POA: Diagnosis not present

## 2020-02-22 DIAGNOSIS — Z6824 Body mass index (BMI) 24.0-24.9, adult: Secondary | ICD-10-CM | POA: Diagnosis not present

## 2020-02-22 DIAGNOSIS — N898 Other specified noninflammatory disorders of vagina: Secondary | ICD-10-CM | POA: Diagnosis not present

## 2020-02-22 DIAGNOSIS — R232 Flushing: Secondary | ICD-10-CM | POA: Diagnosis not present

## 2020-04-15 ENCOUNTER — Encounter: Payer: Self-pay | Admitting: Family Medicine

## 2020-05-15 ENCOUNTER — Encounter: Payer: Self-pay | Admitting: Family Medicine

## 2020-05-15 DIAGNOSIS — F320 Major depressive disorder, single episode, mild: Secondary | ICD-10-CM

## 2020-05-15 MED ORDER — ESCITALOPRAM OXALATE 10 MG PO TABS: 10.0000 mg | ORAL_TABLET | Freq: Every day | ORAL | 1 refills | Status: DC

## 2020-06-16 DIAGNOSIS — R6882 Decreased libido: Secondary | ICD-10-CM | POA: Diagnosis not present

## 2020-06-16 DIAGNOSIS — R232 Flushing: Secondary | ICD-10-CM | POA: Diagnosis not present

## 2020-06-16 DIAGNOSIS — N951 Menopausal and female climacteric states: Secondary | ICD-10-CM | POA: Diagnosis not present

## 2020-11-01 DIAGNOSIS — R232 Flushing: Secondary | ICD-10-CM | POA: Diagnosis not present

## 2020-11-01 DIAGNOSIS — R6882 Decreased libido: Secondary | ICD-10-CM | POA: Diagnosis not present

## 2020-11-01 DIAGNOSIS — N951 Menopausal and female climacteric states: Secondary | ICD-10-CM | POA: Diagnosis not present

## 2020-11-16 ENCOUNTER — Encounter: Payer: Self-pay | Admitting: Family Medicine

## 2020-12-05 ENCOUNTER — Other Ambulatory Visit: Payer: Self-pay | Admitting: Family Medicine

## 2020-12-05 DIAGNOSIS — F320 Major depressive disorder, single episode, mild: Secondary | ICD-10-CM

## 2021-02-27 DIAGNOSIS — R6882 Decreased libido: Secondary | ICD-10-CM | POA: Diagnosis not present

## 2021-02-27 DIAGNOSIS — N951 Menopausal and female climacteric states: Secondary | ICD-10-CM | POA: Diagnosis not present

## 2021-02-27 DIAGNOSIS — R232 Flushing: Secondary | ICD-10-CM | POA: Diagnosis not present

## 2021-02-27 DIAGNOSIS — Z6826 Body mass index (BMI) 26.0-26.9, adult: Secondary | ICD-10-CM | POA: Diagnosis not present

## 2021-03-19 ENCOUNTER — Other Ambulatory Visit: Payer: Self-pay | Admitting: Family Medicine

## 2021-03-19 ENCOUNTER — Encounter: Payer: Self-pay | Admitting: Family Medicine

## 2021-03-19 DIAGNOSIS — F320 Major depressive disorder, single episode, mild: Secondary | ICD-10-CM

## 2021-03-19 MED ORDER — ESCITALOPRAM OXALATE 10 MG PO TABS: 10.0000 mg | ORAL_TABLET | Freq: Every day | ORAL | 0 refills | Status: DC

## 2021-04-17 ENCOUNTER — Encounter: Payer: Self-pay | Admitting: Gastroenterology

## 2021-05-13 NOTE — Patient Instructions (Addendum)
It was great to see you again today, I will be in touch with your labs asap ?Keep up the good work with exercise!  Schedule your mammo asap ?First dose of shingrix today- please get your 2nd dose in 2-6 months, can be done as a nurse visit  ? ?

## 2021-05-13 NOTE — Progress Notes (Addendum)
Therapist, music at [facility] ?[address], [address] ?[place], [place] [address] ?336 [phone] ?Fax [phone] ? ?Date:  05/16/2021  ? ?Name:  Nichole Mccarty   DOB:  [birth year]   MRN:  [id number] ? ?PCP:  Darreld Mclean, MD  ? ? ?Chief Complaint: Annual Exam (Concerns/ questions: none/Mammogram: none recent/HIV screen and Hep C screens due/Zoster: never) ? ? ?History of Present Illness: ? ?Nichole Mccarty is a 56 y.o. very pleasant female patient who presents with the following: ? ?Patient seen today for physical ?Most recent visit with myself was in October 2021 ?At that time she had a recent positive Cologuard and had not yet follow-up with GI, I encouraged her to be seen as soon as possible ?She is being seen by GI at the end of the month - colonoscopy scheduled  ? ?Pap--2021, normal  ?Mammogram- this is due, ordered today ?Labs are due today; she is fasting still today  ?Shingrix- we discussed today, she would like to start series ? ?She enjoys yoga, does body pump at [place] ?She has a boyfriend, her 2 young adult daughters are doing well and planning to work in the healthcare field ?Patient Active Problem List  ? Diagnosis Date Noted  ? 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 Acute medial meniscal tear, left, initial encounter 07/02/2017  ? Tear of MCL (medial collateral ligament) of knee, right, initial encounter 05/07/2017  ? ? ?Past Medical History:  ?Diagnosis Date  ? Anxiety   ? ? ?Past Surgical History:  ?Procedure Laterality Date  ? WISDOM TOOTH EXTRACTION    ? ? ?Social History  ? ?Tobacco Use  ? Smoking status: Never  ? Smokeless tobacco: Never  ?Vaping Use  ? Vaping Use: Never used  ?Substance Use Topics  ? Alcohol use: Yes  ? Drug use: No  ? ? ?Family History  ?Problem Relation Age of Onset  ? Colon cancer Neg Hx   ? Esophageal cancer Neg Hx   ? ? ?Allergies  ?Allergen Reactions  ? No Known Allergies   ? ? ?Medication list has been reviewed and updated. ? ?Current  Outpatient Medications on File Prior to Visit  ?Medication Sig Dispense Refill  ? Calcium Carbonate (CALCIUM 600 PO) Take 1 tablet by mouth daily.    ? escitalopram (LEXAPRO) 10 MG tablet Take 1 tablet (10 mg total) by mouth daily. 90 tablet 0  ? mirabegron ER (MYRBETRIQ) 25 MG TB24 tablet Take 25 mg by mouth daily.    ? progesterone (PROMETRIUM) 100 MG capsule TAKE 1 CAPSULE BY MOUTH BEFORE BEDTIME AS DIRECTED    ? tobramycin (TOBREX) 0.3 % ophthalmic solution Place 1 drop into the right eye every 4 (four) hours. Use as needed for eye infection 5 mL 0  ? tobramycin-dexamethasone (TOBRADEX) ophthalmic ointment 1 application 3 (three) times daily.     ? ?No current facility-administered medications on file prior to visit.  ? ? ?Review of Systems: ? ?As per HPI- otherwise negative. ? ? ?Physical Examination: ?Vitals:  ? 05/16/21 1419  ?BP: 122/72  ?Pulse: 62  ?Resp: 18  ?Temp: 98.2 ?F (36.8 ?C)  ?SpO2: 98%  ? ?Vitals:  ? 05/16/21 1419  ?Weight: 136 lb 6.4 oz (61.9 kg)  ?Height: 5' 2.6" (1.59 m)  ? ?Body mass index is 24.47 kg/m?. ?Ideal Body Weight: Weight in (lb) to have BMI = 25: 139.1 ? ?GEN: no acute distress. Normal weight, looks well  Bilateral TM  wnl, oropharynx normal.  PEERL,EOMI.   ?HEENT: Atraumatic, Normocephalic.  ?Ears and Nose: No external deformity. ?CV: RRR, No M/G/R. No JVD. No thrill. No extra heart sounds. ?PULM: CTA B, no wheezes, crackles, rhonchi. No retractions. No resp. distress. No accessory muscle use. ?ABD: S, NT, ND. No rebound. No HSM. ?EXTR: No c/c/e ?PSYCH: Normally interactive. Conversant.  ? ? ?Assessment and Plan: ?Physical exam ? ?Screening for diabetes mellitus - Plan: Comprehensive metabolic panel, Hemoglobin A1c ? ?Screening for thyroid disorder - Plan: TSH ? ?Screening for deficiency anemia - Plan: CBC ? ?Screening for hyperlipidemia - Plan: Lipid panel ? ?Depression, major, single episode, mild ([place]) - Plan: escitalopram (LEXAPRO) 10 MG tablet ? ?Encounter for screening  mammogram for malignant neoplasm of breast - Plan: MM 3D SCREEN BREAST BILATERAL ? ?Urge incontinence - Plan: mirabegron ER (MYRBETRIQ) 25 MG TB24 tablet ? ?Immunization due - Plan: Varicella-zoster vaccine IM (Shingrix) ? ?Physical exam today ?Encourage healthy diet and exercise routine ?Gave first dose of Shingrix ?Colonoscopy is scheduled ?Ordered mammogram ?Refilled medications ?Will plan further follow- up pending labs. ? ?Signed ?Lamar Blinks, MD ? ?Addendum 3/9, received labs as below.  Message to patient ?I only checked her sodium 1 other time, 2 years ago.  At that time it was 134 ?She continues to show minimal decrease in white cell count ?Results for orders placed or performed in visit on 05/16/21  ?CBC  ?Result Value Ref Range  ? WBC 3.6 (L) 4.0 - 10.5 K/uL  ? RBC 4.03 3.87 - 5.11 Mil/uL  ? Platelets 299.0 150.0 - 400.0 K/uL  ? Hemoglobin 12.4 12.0 - 15.0 g/dL  ? HCT 36.7 36.0 - 46.0 %  ? MCV 91.1 78.0 - 100.0 fl  ? MCHC 33.8 30.0 - 36.0 g/dL  ? RDW 12.4 11.5 - 15.5 %  ?Comprehensive metabolic panel  ?Result Value Ref Range  ? Sodium 130 (L) 135 - 145 mEq/L  ? Potassium 3.9 3.5 - 5.1 mEq/L  ? Chloride 94 (L) 96 - 112 mEq/L  ? CO2 27 19 - 32 mEq/L  ? Glucose, Bld 78 70 - 99 mg/dL  ? BUN 11 6 - 23 mg/dL  ? Creatinine, Ser 0.72 0.40 - 1.20 mg/dL  ? Total Bilirubin 1.2 0.2 - 1.2 mg/dL  ? Alkaline Phosphatase 45 39 - 117 U/L  ? AST 24 0 - 37 U/L  ? ALT 18 0 - 35 U/L  ? Total Protein 6.6 6.0 - 8.3 g/dL  ? Albumin 4.7 3.5 - 5.2 g/dL  ? GFR 93.56 >60.00 mL/min  ? Calcium 9.5 8.4 - 10.5 mg/dL  ?Hemoglobin A1c  ?Result Value Ref Range  ? Hgb A1c MFr Bld 5.6 4.6 - 6.5 %  ?Lipid panel  ?Result Value Ref Range  ? Cholesterol 174 0 - 200 mg/dL  ? Triglycerides 52.0 0.0 - 149.0 mg/dL  ? HDL 75.80 >39.00 mg/dL  ? VLDL 10.4 0.0 - 40.0 mg/dL  ? LDL Cholesterol 88 0 - 99 mg/dL  ? Total CHOL/HDL Ratio 2   ? NonHDL 97.94   ?TSH  ?Result Value Ref Range  ? TSH 0.86 0.35 - 5.50 uIU/mL  ? ? ?

## 2021-05-14 ENCOUNTER — Encounter: Payer: BC Managed Care – PPO | Admitting: Family Medicine

## 2021-05-16 ENCOUNTER — Ambulatory Visit (INDEPENDENT_AMBULATORY_CARE_PROVIDER_SITE_OTHER): Payer: BC Managed Care – PPO | Admitting: Family Medicine

## 2021-05-16 VITALS — BP 122/72 | HR 62 | Temp 98.2°F | Resp 18 | Ht 62.6 in | Wt 136.4 lb

## 2021-05-16 DIAGNOSIS — F320 Major depressive disorder, single episode, mild: Secondary | ICD-10-CM

## 2021-05-16 DIAGNOSIS — Z130 Encounter for screening for diseases of the blood and blood-forming organs and certain disorders involving the immune mechanism: Secondary | ICD-10-CM | POA: Diagnosis not present

## 2021-05-16 DIAGNOSIS — Z131 Encounter for screening for diabetes mellitus: Secondary | ICD-10-CM | POA: Diagnosis not present

## 2021-05-16 DIAGNOSIS — E871 Hypo-osmolality and hyponatremia: Secondary | ICD-10-CM

## 2021-05-16 DIAGNOSIS — Z23 Encounter for immunization: Secondary | ICD-10-CM

## 2021-05-16 DIAGNOSIS — Z0000 Encounter for general adult medical examination without abnormal findings: Secondary | ICD-10-CM

## 2021-05-16 DIAGNOSIS — N3941 Urge incontinence: Secondary | ICD-10-CM

## 2021-05-16 DIAGNOSIS — Z1329 Encounter for screening for other suspected endocrine disorder: Secondary | ICD-10-CM

## 2021-05-16 DIAGNOSIS — Z13220 Encounter for screening for lipoid disorders: Secondary | ICD-10-CM

## 2021-05-16 DIAGNOSIS — Z1231 Encounter for screening mammogram for malignant neoplasm of breast: Secondary | ICD-10-CM

## 2021-05-16 MED ORDER — MIRABEGRON ER 25 MG PO TB24: 25.0000 mg | ORAL_TABLET | Freq: Every day | ORAL | 2 refills | Status: DC

## 2021-05-16 MED ORDER — ESCITALOPRAM OXALATE 10 MG PO TABS: 10.0000 mg | ORAL_TABLET | Freq: Every day | ORAL | 3 refills | Status: DC

## 2021-05-17 ENCOUNTER — Encounter: Payer: Self-pay | Admitting: Family Medicine

## 2021-05-17 LAB — TSH: TSH: 0.86 u[IU]/mL (ref 0.35–5.50)

## 2021-05-17 LAB — CBC
HCT: 36.7 % (ref 36.0–46.0)
Hemoglobin: 12.4 g/dL (ref 12.0–15.0)
MCHC: 33.8 g/dL (ref 30.0–36.0)
MCV: 91.1 fl (ref 78.0–100.0)
Platelets: 299 10*3/uL (ref 150.0–400.0)
RBC: 4.03 Mil/uL (ref 3.87–5.11)
RDW: 12.4 % (ref 11.5–15.5)
WBC: 3.6 10*3/uL — ABNORMAL LOW (ref 4.0–10.5)

## 2021-05-17 LAB — COMPREHENSIVE METABOLIC PANEL
ALT: 18 U/L (ref 0–35)
AST: 24 U/L (ref 0–37)
Albumin: 4.7 g/dL (ref 3.5–5.2)
Alkaline Phosphatase: 45 U/L (ref 39–117)
BUN: 11 mg/dL (ref 6–23)
CO2: 27 mEq/L (ref 19–32)
Calcium: 9.5 mg/dL (ref 8.4–10.5)
Chloride: 94 mEq/L — ABNORMAL LOW (ref 96–112)
Creatinine, Ser: 0.72 mg/dL (ref 0.40–1.20)
GFR: 93.56 mL/min (ref >60.00)
Glucose, Bld: 78 mg/dL (ref 70–99)
Potassium: 3.9 mEq/L (ref 3.5–5.1)
Sodium: 130 mEq/L — ABNORMAL LOW (ref 135–145)
Total Bilirubin: 1.2 mg/dL (ref 0.2–1.2)
Total Protein: 6.6 g/dL (ref 6.0–8.3)

## 2021-05-17 LAB — LIPID PANEL
Cholesterol: 174 mg/dL (ref 0–200)
HDL: 75.8 mg/dL (ref >39.00)
LDL Cholesterol: 88 mg/dL (ref 0–99)
NonHDL: 97.94
Total CHOL/HDL Ratio: 2
Triglycerides: 52 mg/dL (ref 0.0–149.0)
VLDL: 10.4 mg/dL (ref 0.0–40.0)

## 2021-05-17 LAB — HEMOGLOBIN A1C: Hgb A1c MFr Bld: 5.6 % (ref 4.6–6.5)

## 2021-05-17 NOTE — Addendum Note (Signed)
Addended by: Lamar Blinks C on: 05/17/2021 12:52 PM ? ? Modules accepted: Orders ? ?

## 2021-05-22 ENCOUNTER — Ambulatory Visit: Payer: BC Managed Care – PPO | Admitting: *Deleted

## 2021-05-22 ENCOUNTER — Other Ambulatory Visit: Payer: Self-pay

## 2021-05-22 VITALS — Ht 63.0 in | Wt 135.0 lb

## 2021-05-22 DIAGNOSIS — R195 Other fecal abnormalities: Secondary | ICD-10-CM

## 2021-05-22 MED ORDER — NA SULFATE-K SULFATE-MG SULF 17.5-3.13-1.6 GM/177ML PO SOLN: 1.0000 | Freq: Once | ORAL | 0 refills | Status: AC

## 2021-05-22 NOTE — Progress Notes (Signed)

## 2021-05-24 ENCOUNTER — Encounter: Payer: Self-pay | Admitting: Family Medicine

## 2021-05-29 ENCOUNTER — Inpatient Hospital Stay (HOSPITAL_BASED_OUTPATIENT_CLINIC_OR_DEPARTMENT_OTHER): Admission: RE | Admit: 2021-05-29 | Payer: BC Managed Care – PPO | Source: Ambulatory Visit

## 2021-05-30 ENCOUNTER — Other Ambulatory Visit: Payer: Self-pay

## 2021-05-30 ENCOUNTER — Encounter (HOSPITAL_BASED_OUTPATIENT_CLINIC_OR_DEPARTMENT_OTHER): Payer: Self-pay

## 2021-05-30 ENCOUNTER — Ambulatory Visit (HOSPITAL_BASED_OUTPATIENT_CLINIC_OR_DEPARTMENT_OTHER)
Admission: RE | Admit: 2021-05-30 | Discharge: 2021-05-30 | Disposition: A | Discharge status: Home / Self Care | Payer: BC Managed Care – PPO | Source: Ambulatory Visit | Attending: Family Medicine | Admitting: Family Medicine

## 2021-05-30 DIAGNOSIS — Z1231 Encounter for screening mammogram for malignant neoplasm of breast: Secondary | ICD-10-CM | POA: Diagnosis not present

## 2021-06-04 ENCOUNTER — Encounter: Payer: Self-pay | Admitting: Gastroenterology

## 2021-06-05 ENCOUNTER — Encounter: Payer: Self-pay | Admitting: Gastroenterology

## 2021-06-05 ENCOUNTER — Ambulatory Visit (AMBULATORY_SURGERY_CENTER): Payer: BC Managed Care – PPO | Admitting: Gastroenterology

## 2021-06-05 VITALS — BP 95/70 | HR 45 | Temp 97.0°F | Resp 12 | Ht 63.0 in | Wt 135.0 lb

## 2021-06-05 DIAGNOSIS — K635 Polyp of colon: Secondary | ICD-10-CM | POA: Diagnosis not present

## 2021-06-05 DIAGNOSIS — D120 Benign neoplasm of cecum: Secondary | ICD-10-CM

## 2021-06-05 DIAGNOSIS — R195 Other fecal abnormalities: Secondary | ICD-10-CM | POA: Diagnosis not present

## 2021-06-05 DIAGNOSIS — D122 Benign neoplasm of ascending colon: Secondary | ICD-10-CM

## 2021-06-05 DIAGNOSIS — Z1211 Encounter for screening for malignant neoplasm of colon: Secondary | ICD-10-CM | POA: Diagnosis not present

## 2021-06-05 DIAGNOSIS — D123 Benign neoplasm of transverse colon: Secondary | ICD-10-CM

## 2021-06-05 HISTORY — PX: COLONOSCOPY: SHX174

## 2021-06-05 MED ORDER — SODIUM CHLORIDE 0.9 % IV SOLN: 500.0000 mL | Freq: Once | INTRAVENOUS | Status: DC

## 2021-06-05 NOTE — Patient Instructions (Signed)
Discharge instructions given. Handout on polyps. Resume previous medications. YOU HAD AN ENDOSCOPIC PROCEDURE TODAY AT THE [facility]:   Refer to the procedure report that was given to you for any specific questions about what was found during the examination.  If the procedure report does not answer your questions, please call your gastroenterologist to clarify.  If you requested that your care partner not be given the details of your procedure findings, then the procedure report has been included in a sealed envelope for you to review at your convenience later.  YOU SHOULD EXPECT: Some feelings of bloating in the abdomen. Passage of more gas than usual.  Walking can help get rid of the air that was put into your GI tract during the procedure and reduce the bloating. If you had a lower endoscopy (such as a colonoscopy or flexible sigmoidoscopy) you may notice spotting of blood in your stool or on the toilet paper. If you underwent a bowel prep for your procedure, you may not have a normal bowel movement for a few days.  Please Note:  You might notice some irritation and congestion in your nose or some drainage.  This is from the oxygen used during your procedure.  There is no need for concern and it should clear up in a day or so.  SYMPTOMS TO REPORT IMMEDIATELY:  Following lower endoscopy (colonoscopy or flexible sigmoidoscopy):  Excessive amounts of blood in the stool  Significant tenderness or worsening of abdominal pains  Swelling of the abdomen that is new, acute  Fever of 100F or higher   For urgent or emergent issues, a gastroenterologist can be reached at any hour by calling [phone]. Do not use MyChart messaging for urgent concerns.    DIET:  We do recommend a small meal at first, but then you may proceed to your regular diet.  Drink plenty of fluids but you should avoid alcoholic beverages for 24 hours.  ACTIVITY:  You should plan to take it easy for the rest  of today and you should NOT DRIVE or use heavy machinery until tomorrow (because of the sedation medicines used during the test).    FOLLOW UP: Our staff will call the number listed on your records 48-72 hours following your procedure to check on you and address any questions or concerns that you may have regarding the information given to you following your procedure. If we do not reach you, we will leave a message.  We will attempt to reach you two times.  During this call, we will ask if you have developed any symptoms of COVID 19. If you develop any symptoms (ie: fever, flu-like symptoms, shortness of breath, cough etc.) before then, please call [phone].  If you test positive for Covid 19 in the 2 weeks post procedure, please call and report this information to us.    If any biopsies were taken you will be contacted by phone or by letter within the next 1-3 weeks.  Please call us at [phone] if you have not heard about the biopsies in 3 weeks.    SIGNATURES/CONFIDENTIALITY: You and/or your care partner have signed paperwork which will be entered into your electronic medical record.  These signatures attest to the fact that that the information above on your After Visit Summary has been reviewed and is understood.  Full responsibility of the confidentiality of this discharge information lies with you and/or your care-partner.  

## 2021-06-05 NOTE — Progress Notes (Signed)
Pt's states no medical or surgical changes since previsit or office visit. 

## 2021-06-05 NOTE — Op Note (Signed)
[facility] ?Patient Name: Nichole Mccarty ?Procedure Date: 06/05/2021 8:31 AM ?MRN: [id number] ?Endoscopist: Milus Banister , MD ?Age: 56 ?Referring MD:  ?Date of Birth: [birth year] ?Gender: Female ?Account #: [id number] ?Procedure:                Colonoscopy ?Indications:              Positive Cologuard test ?Medicines:                Monitored Anesthesia Care ?Procedure:                Pre-Anesthesia Assessment: ?                          - Prior to the procedure, a History and Physical  ?                          was performed, and patient medications and  ?                          allergies were reviewed. The patient's tolerance of  ?                          previous anesthesia was also reviewed. The risks  ?                          and benefits of the procedure and the sedation  ?                          options and risks were discussed with the patient.  ?                          All questions were answered, and informed consent  ?                          was obtained. Prior Anticoagulants: The patient has  ?                          taken no previous anticoagulant or antiplatelet  ?                          agents. ASA Grade Assessment: II - A patient with  ?                          mild systemic disease. After reviewing the risks  ?                          and benefits, the patient was deemed in  ?                          satisfactory condition to undergo the procedure. ?                          After obtaining informed consent, the colonoscope  ?  was passed under direct vision. Throughout the  ?                          procedure, the patient's blood pressure, pulse, and  ?                          oxygen saturations were monitored continuously. The  ?                          Olympus [id number]-H190DL (#[id number]) Colonoscope was  ?                          introduced through the anus and advanced to the the  ?                          cecum, identified by appendiceal  orifice and  ?                          ileocecal valve. The colonoscopy was performed  ?                          without difficulty. The patient tolerated the  ?                          procedure well. The quality of the bowel  ?                          preparation was good. The ileocecal valve,  ?                          appendiceal orifice, and rectum were photographed. ?Scope In: 8:42:17 AM ?Scope Out: 8:57:16 AM ?Scope Withdrawal Time: 0 hours 10 minutes 17 seconds  ?Total Procedure Duration: 0 hours 14 minutes 59 seconds  ?Findings:                 Three sessile polyps were found in the transverse  ?                          colon, ascending colon and cecum. The polyps were 6  ?                          to 9 mm in size. These polyps were removed with a  ?                          cold snare. Resection and retrieval were complete. ?                          The exam was otherwise without abnormality on  ?                          direct and retroflexion views. ?Complications:            No immediate complications. Estimated blood loss:  ?  None. ?Estimated Blood Loss:     Estimated blood loss: none. ?Impression:               - Three 6 to 9 mm polyps in the transverse colon,  ?                          in the ascending colon and in the cecum, removed  ?                          with a cold snare. Resected and retrieved. ?                          - The examination was otherwise normal on direct  ?                          and retroflexion views. ?Recommendation:           - Patient has a contact number available for  ?                          emergencies. The signs and symptoms of potential  ?                          delayed complications were discussed with the  ?                          patient. Return to normal activities tomorrow.  ?                          Written discharge instructions were provided to the  ?                          patient. ?                          -  Resume previous diet. ?                          - Continue present medications. ?                          - Await pathology results. ?Milus Banister, MD ?06/05/2021 8:59:23 AM ?This report has been signed electronically. ?

## 2021-06-05 NOTE — Progress Notes (Signed)
Called to room to assist during endoscopic procedure.  Patient ID and intended procedure confirmed with present staff. Received instructions for my participation in the procedure from the performing physician.  

## 2021-06-05 NOTE — Progress Notes (Signed)
HPI: ?This is a woman with + cologuard testing 05/2019 ? ? ?ROS: complete GI ROS as described in HPI, all other review negative. ? ?Constitutional:  No unintentional weight loss ? ? ?Past Medical History:  ?Diagnosis Date  ? Allergy   ? seasonal  ? Anxiety   ? ? ?Past Surgical History:  ?Procedure Laterality Date  ? COLONOSCOPY  06/05/2021  ? jaw surgey    ? age 56  ? WISDOM TOOTH EXTRACTION    ? ? ?Current Outpatient Medications  ?Medication Sig Dispense Refill  ? escitalopram (LEXAPRO) 10 MG tablet Take 1 tablet (10 mg total) by mouth daily. 90 tablet 3  ? Multiple Vitamin (MULTIVITAMIN PO) Take by mouth daily. Women  take one tablet    ? Omega-3 Fatty Acids (FISH OIL PO) Take by mouth daily. Take one capsule daily    ? progesterone (PROMETRIUM) 100 MG capsule TAKE 1 CAPSULE BY MOUTH BEFORE BEDTIME AS DIRECTED    ? Calcium Carbonate (CALCIUM 600 PO) Take 1 tablet by mouth daily.    ? mirabegron ER (MYRBETRIQ) 25 MG TB24 tablet Take 1 tablet (25 mg total) by mouth daily. (Patient taking differently: Take 25 mg by mouth as needed.) 30 tablet 2  ? ?Current Facility-Administered Medications  ?Medication Dose Route Frequency Provider Last Rate Last Admin  ? 0.9 %  sodium chloride infusion  500 mL Intravenous Once Milus Banister, MD      ? ? ?Allergies as of 06/05/2021  ? (No Known Allergies)  ? ? ?Family History  ?Problem Relation Age of Onset  ? Colon cancer Neg Hx   ? Esophageal cancer Neg Hx   ? Colon polyps Neg Hx   ? Rectal cancer Neg Hx   ? Stomach cancer Neg Hx   ? ? ?Social History  ? ?Socioeconomic History  ? Marital status: Single  ?  Spouse name: Not on file  ? Number of children: Not on file  ? Years of education: Not on file  ? Highest education level: Not on file  ?Occupational History  ? Occupation: Sales  ?Tobacco Use  ? Smoking status: Never  ? Smokeless tobacco: Never  ?Vaping Use  ? Vaping Use: Never used  ?Substance and Sexual Activity  ? Alcohol use: Yes  ?  Comment: couple drinks 2 x per week  ?  Drug use: No  ? Sexual activity: Yes  ?  Birth control/protection: Post-menopausal  ?Other Topics Concern  ? Not on file  ?Social History Narrative  ? Not on file  ? ?Social Determinants of Health  ? ?Financial Resource Strain: Not on file  ?Food Insecurity: Not on file  ?Transportation Needs: Not on file  ?Physical Activity: Not on file  ?Stress: Not on file  ?Social Connections: Not on file  ?Intimate Partner Violence: Not on file  ? ? ? ?Physical Exam: ?BP 90/60   Pulse 60   Temp (!) 97 ?F (36.1 ?C) (Temporal)   Ht '5\' 3"'$  (1.6 m)   Wt 135 lb (61.2 kg)   SpO2 99%   BMI 23.91 kg/m?  ?Constitutional: generally well-appearing ?Psychiatric: alert and oriented x3 ?Lungs: CTA bilaterally ?Heart: no MCR ? ?Assessment and plan: ?56 y.o. female with 05/2019 + cologuard stool ? ?Colonosxopy today ? ?Care is appropriate for the ambulatory setting. ? ?Owens Loffler, MD ?[facility] ?06/05/2021, 8:32 AM ? ? ? ?

## 2021-06-05 NOTE — Progress Notes (Signed)
To Pacu, VSS. Report to [email or web address] 

## 2021-06-07 ENCOUNTER — Telehealth: Payer: Self-pay | Admitting: *Deleted

## 2021-06-07 ENCOUNTER — Telehealth: Payer: Self-pay

## 2021-06-07 NOTE — Telephone Encounter (Signed)
?  Follow up Call- ? ? ?  06/05/2021  ?  8:17 AM  ?Call back number  ?Post procedure Call Back phone  # [phone]  ?Permission to leave phone message No  ?  ?2nd follow up call made.  NA asked not to leave message. ? ?

## 2021-06-07 NOTE — Telephone Encounter (Signed)
?  Follow up Call- ? ? ?  06/05/2021  ?  8:17 AM  ?Call back number  ?Post procedure Call Back phone  # [phone]  ?Permission to leave phone message No  ?  ? ?Patient questions: ?Message left to call us if necessary. ?

## 2021-06-11 ENCOUNTER — Encounter: Payer: Self-pay | Admitting: Gastroenterology

## 2021-07-04 DIAGNOSIS — N951 Menopausal and female climacteric states: Secondary | ICD-10-CM | POA: Diagnosis not present

## 2021-07-06 DIAGNOSIS — N951 Menopausal and female climacteric states: Secondary | ICD-10-CM | POA: Diagnosis not present

## 2021-07-06 DIAGNOSIS — R232 Flushing: Secondary | ICD-10-CM | POA: Diagnosis not present

## 2021-07-06 DIAGNOSIS — N898 Other specified noninflammatory disorders of vagina: Secondary | ICD-10-CM | POA: Diagnosis not present

## 2021-07-06 DIAGNOSIS — Z6825 Body mass index (BMI) 25.0-25.9, adult: Secondary | ICD-10-CM | POA: Diagnosis not present

## 2021-07-22 ENCOUNTER — Encounter: Payer: Self-pay | Admitting: Family Medicine

## 2021-08-01 ENCOUNTER — Ambulatory Visit: Payer: BC Managed Care – PPO

## 2021-08-14 ENCOUNTER — Ambulatory Visit (INDEPENDENT_AMBULATORY_CARE_PROVIDER_SITE_OTHER): Payer: BC Managed Care – PPO

## 2021-08-14 DIAGNOSIS — Z23 Encounter for immunization: Secondary | ICD-10-CM

## 2021-08-14 NOTE — Progress Notes (Signed)
Nichole Mccarty is a 56 y.o. female presents to the office today for #2 Shingles injections, per physician's orders. Original order:  left deltiod (route) was administered  (location) today. Patient tolerated injection.   Nichole Mccarty M Alicya Bena

## 2021-08-16 ENCOUNTER — Ambulatory Visit: Payer: BC Managed Care – PPO

## 2021-09-27 DIAGNOSIS — F4320 Adjustment disorder, unspecified: Secondary | ICD-10-CM | POA: Diagnosis not present

## 2021-10-02 DIAGNOSIS — F4323 Adjustment disorder with mixed anxiety and depressed mood: Secondary | ICD-10-CM | POA: Diagnosis not present

## 2021-10-04 ENCOUNTER — Encounter: Payer: Self-pay | Admitting: Family Medicine

## 2021-10-04 ENCOUNTER — Other Ambulatory Visit: Payer: Self-pay | Admitting: Family Medicine

## 2021-10-04 MED ORDER — PROGESTERONE MICRONIZED 100 MG PO CAPS: ORAL_CAPSULE | ORAL | 3 refills | Status: DC

## 2021-10-04 NOTE — Addendum Note (Signed)
Addended by: Lamar Blinks C on: 10/04/2021 07:34 PM   Modules accepted: Orders

## 2021-10-10 DIAGNOSIS — F4323 Adjustment disorder with mixed anxiety and depressed mood: Secondary | ICD-10-CM | POA: Diagnosis not present

## 2021-10-30 DIAGNOSIS — E039 Hypothyroidism, unspecified: Secondary | ICD-10-CM | POA: Diagnosis not present

## 2021-10-30 DIAGNOSIS — N951 Menopausal and female climacteric states: Secondary | ICD-10-CM | POA: Diagnosis not present

## 2021-11-01 DIAGNOSIS — N898 Other specified noninflammatory disorders of vagina: Secondary | ICD-10-CM | POA: Diagnosis not present

## 2021-11-01 DIAGNOSIS — F4323 Adjustment disorder with mixed anxiety and depressed mood: Secondary | ICD-10-CM | POA: Diagnosis not present

## 2021-11-01 DIAGNOSIS — R232 Flushing: Secondary | ICD-10-CM | POA: Diagnosis not present

## 2021-11-01 DIAGNOSIS — N951 Menopausal and female climacteric states: Secondary | ICD-10-CM | POA: Diagnosis not present

## 2021-11-01 DIAGNOSIS — Z6824 Body mass index (BMI) 24.0-24.9, adult: Secondary | ICD-10-CM | POA: Diagnosis not present

## 2021-11-06 DIAGNOSIS — L821 Other seborrheic keratosis: Secondary | ICD-10-CM | POA: Diagnosis not present

## 2021-11-06 DIAGNOSIS — D225 Melanocytic nevi of trunk: Secondary | ICD-10-CM | POA: Diagnosis not present

## 2021-11-06 DIAGNOSIS — L578 Other skin changes due to chronic exposure to nonionizing radiation: Secondary | ICD-10-CM | POA: Diagnosis not present

## 2021-11-06 DIAGNOSIS — L814 Other melanin hyperpigmentation: Secondary | ICD-10-CM | POA: Diagnosis not present

## 2021-11-23 ENCOUNTER — Encounter: Payer: Self-pay | Admitting: Family Medicine

## 2021-11-23 MED ORDER — PROGESTERONE MICRONIZED 100 MG PO CAPS: ORAL_CAPSULE | ORAL | 3 refills | Status: DC

## 2021-11-23 NOTE — Telephone Encounter (Signed)
90, 3 refills were sent in July. Would this be due?

## 2021-11-23 NOTE — Addendum Note (Signed)
Addended by: Darreld Mclean on: 11/23/2021 06:43 PM   Modules accepted: Orders

## 2021-11-27 MED ORDER — PROGESTERONE MICRONIZED 100 MG PO CAPS: ORAL_CAPSULE | ORAL | 0 refills | Status: DC

## 2021-11-27 NOTE — Addendum Note (Signed)
Addended by: Lamar Blinks C on: 11/27/2021 10:51 AM   Modules accepted: Orders

## 2022-01-17 ENCOUNTER — Encounter: Payer: Self-pay | Admitting: Family Medicine

## 2022-01-18 NOTE — Telephone Encounter (Signed)
Is this do-able?

## 2022-04-01 DIAGNOSIS — N951 Menopausal and female climacteric states: Secondary | ICD-10-CM | POA: Diagnosis not present

## 2022-04-01 DIAGNOSIS — E039 Hypothyroidism, unspecified: Secondary | ICD-10-CM | POA: Diagnosis not present

## 2022-04-03 DIAGNOSIS — N951 Menopausal and female climacteric states: Secondary | ICD-10-CM | POA: Diagnosis not present

## 2022-04-03 DIAGNOSIS — Z6824 Body mass index (BMI) 24.0-24.9, adult: Secondary | ICD-10-CM | POA: Diagnosis not present

## 2022-04-03 DIAGNOSIS — R6882 Decreased libido: Secondary | ICD-10-CM | POA: Diagnosis not present

## 2022-04-03 DIAGNOSIS — R232 Flushing: Secondary | ICD-10-CM | POA: Diagnosis not present

## 2022-04-09 ENCOUNTER — Encounter: Payer: Self-pay | Admitting: Family Medicine

## 2022-04-09 DIAGNOSIS — F320 Major depressive disorder, single episode, mild: Secondary | ICD-10-CM

## 2022-04-09 MED ORDER — ESCITALOPRAM OXALATE 10 MG PO TABS: 10.0000 mg | ORAL_TABLET | Freq: Every day | ORAL | 3 refills | Status: DC

## 2022-04-16 MED ORDER — ESCITALOPRAM OXALATE 10 MG PO TABS: 10.0000 mg | ORAL_TABLET | Freq: Every day | ORAL | 3 refills | Status: DC

## 2022-04-16 NOTE — Addendum Note (Signed)
Addended byDamita Dunnings D on: 04/16/2022 02:15 PM   Modules accepted: Orders

## 2022-05-14 NOTE — Patient Instructions (Addendum)
Good to see you again today- I will be in touch with your labs asap I ordered your mammogram- you can stop at imaging on the ground floor and schedule if you like  I would suggest trying a dose of miralax (or generic polyethylene glycol) every other day to see if we can prevent constipation Let me know how this works for you Also please let me know how you respond to the increase dose of lexapro

## 2022-05-14 NOTE — Progress Notes (Signed)
[place] at [facility] [address] [place], [place] [address] [phone]  Date:  05/22/2022   Name:  Nichole Mccarty   DOB:  1966/02/20   MRN:  [id number]  PCP:  Nichole Mclean, MD    Chief Complaint: Annual Exam (Concerns/ questions: stomach issues, increasing the Lexapro dose. Can you take over the Progesterone. /Hep C / HIV screens due/Flu shot today: missed this seasons/)   History of Present Illness:  Nichole Mccarty is a 57 y.o. very pleasant female patient who presents with the following:  Pt seen today for a CPE Last seen by myself about one year ago for her CPE  Colon cancer screening- she had a colonoscopy last year  Mammo 05/30/21- she will schedule soon Flu shot- decline this year as it is mid march  Covid booster recommended Labs done one year ago  Pap 20201 negative - no abnl in the past   She notes she may have uncomfortable constipation at time- she may go up to about 5 days without a BM She may take an OTC laxative but she is not really sure what this is   She did ask her GI about this but they did not really say anything- per pt nothing appeared to be off  This is her lifelong bowel pattern- no real change here  She notes she is better with lexapro 10 which she does like  - however she wonders about going up to 20 mg, would like to try this for some residual anxiety  She has been on progesterone for sleep for about 3 years now - she wonders if I can refill this for her  It works well for her and she would like to continue  Patient Active Problem List   Diagnosis Date Noted   Loss of transverse plantar arch 02/10/2020   Acute medial meniscal tear, left, initial encounter 07/02/2017   Tear of MCL (medial collateral ligament) of knee, right, initial encounter 05/07/2017    Past Medical History:  Diagnosis Date   Allergy    seasonal   Anxiety     Past Surgical History:  Procedure Laterality Date    COLONOSCOPY  06/05/2021   jaw surgey     age 67   WISDOM TOOTH EXTRACTION      Social History   Tobacco Use   Smoking status: Never   Smokeless tobacco: Never  Vaping Use   Vaping Use: Never used  Substance Use Topics   Alcohol use: Yes    Comment: couple drinks 2 x per week   Drug use: No    Family History  Problem Relation Age of Onset   Colon cancer Neg Hx    Esophageal cancer Neg Hx    Colon polyps Neg Hx    Rectal cancer Neg Hx    Stomach cancer Neg Hx     No Known Allergies  Medication list has been reviewed and updated.  Current Outpatient Medications on File Prior to Visit  Medication Sig Dispense Refill   Calcium Carbonate (CALCIUM 600 PO) Take 1 tablet by mouth daily.     escitalopram (LEXAPRO) 10 MG tablet Take 1 tablet (10 mg total) by mouth daily. 90 tablet 3   mirabegron ER (MYRBETRIQ) 25 MG TB24 tablet Take 1 tablet (25 mg total) by mouth daily. (Patient taking differently: Take 25 mg by mouth as needed.) 30 tablet 2   Multiple Vitamin (MULTIVITAMIN PO)  Take by mouth daily. Women  take one tablet     Omega-3 Fatty Acids (FISH OIL PO) Take by mouth daily. Take one capsule daily     progesterone (PROMETRIUM) 100 MG capsule TAKE 1 CAPSULE BY MOUTH BEFORE BEDTIME AS DIRECTED 20 capsule 0   No current facility-administered medications on file prior to visit.    Review of Systems:  As per HPI- otherwise negative.   Physical Examination: Vitals:   05/22/22 1304  BP: 100/60  Pulse: (!) 50  Resp: 18  Temp: 97.6 F (36.4 C)  SpO2: 99%   Vitals:   05/22/22 1304  Weight: 134 lb 3.2 oz (60.9 kg)  Height: '5\' 3"'$  (1.6 m)   Body mass index is 23.77 kg/m. Ideal Body Weight: Weight in (lb) to have BMI = 25: 140.8  GEN: no acute distress. Normal weight, looks well  HEENT: Atraumatic, Normocephalic.  Bilateral TM wnl, oropharynx normal.  PEERL,EOMI.   Ears and Nose: No external deformity. CV: RRR, No M/G/R. No JVD. No thrill. No extra heart  sounds. PULM: CTA B, no wheezes, crackles, rhonchi. No retractions. No resp. distress. No accessory muscle use. ABD: S, NT, ND, +BS. No rebound. No HSM. EXTR: No c/c/e PSYCH: Normally interactive. Conversant.    Assessment and Plan: Physical exam  Thyroid disorder screening - Plan: TSH  Screening for diabetes mellitus - Plan: Comprehensive metabolic panel, Hemoglobin A1c  Fatigue, unspecified type - Plan: CBC, VITAMIN D 25 Hydroxy (Vit-D Deficiency, Fractures)  Screening, lipid - Plan: Lipid panel  Screening mammogram for breast cancer - Plan: MM 3D SCREENING MAMMOGRAM BILATERAL BREAST  Depression, major, single episode, mild ([place]) - Plan: escitalopram (LEXAPRO) 20 MG tablet  Encounter for hepatitis C screening test for low risk patient - Plan: Hepatitis C Antibody  Chronic constipation  Physical exam today- encouraged healthy diet and exercise routine Will plan further follow- up pending labs. Suggested trying miralax QOD for chronic constipation Increase lexapro to 20 mg  Will plan further follow- up pending labs.   Signed Lamar Blinks, MD  Addendum 3/14, received labs as below.  Message to patient and I also called and LMOM on her cell phone   Results for orders placed or performed in visit on 05/22/22  CBC  Result Value Ref Range   WBC 3.8 (L) 4.0 - 10.5 K/uL   RBC 4.40 3.87 - 5.11 Mil/uL   Platelets 300.0 150.0 - 400.0 K/uL   Hemoglobin 13.5 12.0 - 15.0 g/dL   HCT 40.0 36.0 - 46.0 %   MCV 90.7 78.0 - 100.0 fl   MCHC 33.7 30.0 - 36.0 g/dL   RDW 12.6 11.5 - 15.5 %  Comprehensive metabolic panel  Result Value Ref Range   Sodium 128 (L) 135 - 145 mEq/L   Potassium 4.3 3.5 - 5.1 mEq/L   Chloride 92 (L) 96 - 112 mEq/L   CO2 27 19 - 32 mEq/L   Glucose, Bld 83 70 - 99 mg/dL   BUN 10 6 - 23 mg/dL   Creatinine, Ser 0.69 0.40 - 1.20 mg/dL   Total Bilirubin 0.7 0.2 - 1.2 mg/dL   Alkaline Phosphatase 46 39 - 117 U/L   AST 25 0 - 37 U/L   ALT 20 0 - 35 U/L    Total Protein 7.0 6.0 - 8.3 g/dL   Albumin 4.6 3.5 - 5.2 g/dL   GFR 96.43 >60.00 mL/min   Calcium 9.8 8.4 - 10.5 mg/dL  Hemoglobin A1c  Result Value Ref Range  Hgb A1c MFr Bld 5.4 4.6 - 6.5 %  Lipid panel  Result Value Ref Range   Cholesterol 193 0 - 200 mg/dL   Triglycerides 49.0 0.0 - 149.0 mg/dL   HDL 79.70 >39.00 mg/dL   VLDL 9.8 0.0 - 40.0 mg/dL   LDL Cholesterol 103 (H) 0 - 99 mg/dL   Total CHOL/HDL Ratio 2    NonHDL 112.84   TSH  Result Value Ref Range   TSH 0.58 0.35 - 5.50 uIU/mL  VITAMIN D 25 Hydroxy (Vit-D Deficiency, Fractures)  Result Value Ref Range   VITD 45.21 30.00 - 100.00 ng/mL

## 2022-05-22 ENCOUNTER — Ambulatory Visit (INDEPENDENT_AMBULATORY_CARE_PROVIDER_SITE_OTHER): Payer: BC Managed Care – PPO | Admitting: Family Medicine

## 2022-05-22 VITALS — BP 100/60 | HR 50 | Temp 97.6°F | Resp 18 | Ht 63.0 in | Wt 134.2 lb

## 2022-05-22 DIAGNOSIS — Z13220 Encounter for screening for lipoid disorders: Secondary | ICD-10-CM

## 2022-05-22 DIAGNOSIS — Z1329 Encounter for screening for other suspected endocrine disorder: Secondary | ICD-10-CM

## 2022-05-22 DIAGNOSIS — Z0000 Encounter for general adult medical examination without abnormal findings: Secondary | ICD-10-CM | POA: Diagnosis not present

## 2022-05-22 DIAGNOSIS — F320 Major depressive disorder, single episode, mild: Secondary | ICD-10-CM

## 2022-05-22 DIAGNOSIS — Z131 Encounter for screening for diabetes mellitus: Secondary | ICD-10-CM

## 2022-05-22 DIAGNOSIS — E871 Hypo-osmolality and hyponatremia: Secondary | ICD-10-CM

## 2022-05-22 DIAGNOSIS — K5909 Other constipation: Secondary | ICD-10-CM

## 2022-05-22 DIAGNOSIS — R5383 Other fatigue: Secondary | ICD-10-CM

## 2022-05-22 DIAGNOSIS — Z1231 Encounter for screening mammogram for malignant neoplasm of breast: Secondary | ICD-10-CM

## 2022-05-22 DIAGNOSIS — Z1159 Encounter for screening for other viral diseases: Secondary | ICD-10-CM

## 2022-05-22 MED ORDER — PROGESTERONE MICRONIZED 100 MG PO CAPS: ORAL_CAPSULE | ORAL | 3 refills | Status: DC

## 2022-05-22 MED ORDER — ESCITALOPRAM OXALATE 20 MG PO TABS: 20.0000 mg | ORAL_TABLET | Freq: Every day | ORAL | 3 refills | Status: DC

## 2022-05-23 ENCOUNTER — Encounter: Payer: Self-pay | Admitting: Family Medicine

## 2022-05-23 LAB — CBC
HCT: 40 % (ref 36.0–46.0)
Hemoglobin: 13.5 g/dL (ref 12.0–15.0)
MCHC: 33.7 g/dL (ref 30.0–36.0)
MCV: 90.7 fl (ref 78.0–100.0)
Platelets: 300 10*3/uL (ref 150.0–400.0)
RBC: 4.4 Mil/uL (ref 3.87–5.11)
RDW: 12.6 % (ref 11.5–15.5)
WBC: 3.8 10*3/uL — ABNORMAL LOW (ref 4.0–10.5)

## 2022-05-23 LAB — HEPATITIS C ANTIBODY: Hepatitis C Ab: NONREACTIVE

## 2022-05-23 LAB — COMPREHENSIVE METABOLIC PANEL
ALT: 20 U/L (ref 0–35)
AST: 25 U/L (ref 0–37)
Albumin: 4.6 g/dL (ref 3.5–5.2)
Alkaline Phosphatase: 46 U/L (ref 39–117)
BUN: 10 mg/dL (ref 6–23)
CO2: 27 mEq/L (ref 19–32)
Calcium: 9.8 mg/dL (ref 8.4–10.5)
Chloride: 92 mEq/L — ABNORMAL LOW (ref 96–112)
Creatinine, Ser: 0.69 mg/dL (ref 0.40–1.20)
GFR: 96.43 mL/min (ref >60.00)
Glucose, Bld: 83 mg/dL (ref 70–99)
Potassium: 4.3 mEq/L (ref 3.5–5.1)
Sodium: 128 mEq/L — ABNORMAL LOW (ref 135–145)
Total Bilirubin: 0.7 mg/dL (ref 0.2–1.2)
Total Protein: 7 g/dL (ref 6.0–8.3)

## 2022-05-23 LAB — LIPID PANEL
Cholesterol: 193 mg/dL (ref 0–200)
HDL: 79.7 mg/dL (ref >39.00)
LDL Cholesterol: 103 mg/dL — ABNORMAL HIGH (ref 0–99)
NonHDL: 112.84
Total CHOL/HDL Ratio: 2
Triglycerides: 49 mg/dL (ref 0.0–149.0)
VLDL: 9.8 mg/dL (ref 0.0–40.0)

## 2022-05-23 LAB — HEMOGLOBIN A1C: Hgb A1c MFr Bld: 5.4 % (ref 4.6–6.5)

## 2022-05-23 LAB — TSH: TSH: 0.58 u[IU]/mL (ref 0.35–5.50)

## 2022-05-23 LAB — VITAMIN D 25 HYDROXY (VIT D DEFICIENCY, FRACTURES): VITD: 45.21 ng/mL (ref 30.00–100.00)

## 2022-05-23 NOTE — Addendum Note (Signed)
Addended by: Lamar Blinks C on: 05/23/2022 02:27 PM   Modules accepted: Orders

## 2022-06-04 ENCOUNTER — Encounter (INDEPENDENT_AMBULATORY_CARE_PROVIDER_SITE_OTHER): Payer: BC Managed Care – PPO | Admitting: Family Medicine

## 2022-06-04 DIAGNOSIS — J020 Streptococcal pharyngitis: Secondary | ICD-10-CM | POA: Diagnosis not present

## 2022-06-04 MED ORDER — PENICILLIN V POTASSIUM 500 MG PO TABS: 500.0000 mg | ORAL_TABLET | Freq: Two times a day (BID) | ORAL | 0 refills | Status: DC

## 2022-06-04 NOTE — Addendum Note (Signed)
Addended by: Lamar Blinks C on: 06/04/2022 04:41 PM   Modules accepted: Orders

## 2022-06-04 NOTE — Telephone Encounter (Signed)
Please see the MyChart message reply(ies) for my assessment and plan.  The patient gave consent for this Medical Advice Message and is aware that it may result in a bill to their insurance company as well as the possibility that this may result in a co-payment or deductible. They are an established patient, but are not seeking medical advice exclusively about a problem treated during an in person or video visit in the last 7 days. I did not recommend an in person or video visit within 7 days of my reply.  I spent a total of 10 minutes cumulative time within 7 days through MyChart messaging Nichole Pullin, MD  

## 2022-06-04 NOTE — Telephone Encounter (Signed)
Are you okay with treating blindly? And from so far? Or should she go to UC?

## 2022-07-02 ENCOUNTER — Encounter (HOSPITAL_BASED_OUTPATIENT_CLINIC_OR_DEPARTMENT_OTHER): Payer: Self-pay

## 2022-07-02 ENCOUNTER — Ambulatory Visit (HOSPITAL_BASED_OUTPATIENT_CLINIC_OR_DEPARTMENT_OTHER)
Admission: RE | Admit: 2022-07-02 | Discharge: 2022-07-02 | Disposition: A | Discharge status: Home / Self Care | Payer: BC Managed Care – PPO | Source: Ambulatory Visit | Attending: Family Medicine | Admitting: Family Medicine

## 2022-07-02 DIAGNOSIS — Z1231 Encounter for screening mammogram for malignant neoplasm of breast: Secondary | ICD-10-CM | POA: Diagnosis not present

## 2022-07-25 DIAGNOSIS — N951 Menopausal and female climacteric states: Secondary | ICD-10-CM | POA: Diagnosis not present

## 2022-07-25 DIAGNOSIS — E039 Hypothyroidism, unspecified: Secondary | ICD-10-CM | POA: Diagnosis not present

## 2022-07-30 DIAGNOSIS — N951 Menopausal and female climacteric states: Secondary | ICD-10-CM | POA: Diagnosis not present

## 2022-07-30 DIAGNOSIS — R6882 Decreased libido: Secondary | ICD-10-CM | POA: Diagnosis not present

## 2022-07-30 DIAGNOSIS — Z6824 Body mass index (BMI) 24.0-24.9, adult: Secondary | ICD-10-CM | POA: Diagnosis not present

## 2022-07-30 DIAGNOSIS — R232 Flushing: Secondary | ICD-10-CM | POA: Diagnosis not present

## 2022-09-17 ENCOUNTER — Encounter: Payer: Self-pay | Admitting: Family Medicine

## 2022-09-17 DIAGNOSIS — F320 Major depressive disorder, single episode, mild: Secondary | ICD-10-CM

## 2022-09-17 MED ORDER — ESCITALOPRAM OXALATE 10 MG PO TABS: 10.0000 mg | ORAL_TABLET | Freq: Every day | ORAL | 1 refills | Status: DC

## 2022-09-17 NOTE — Telephone Encounter (Signed)
FYI: I went ahead and sent in the 10 mg dose per pt request since she saw no changes on 20 mg dose.

## 2022-09-28 IMAGING — MG MM DIGITAL SCREENING BILAT W/ TOMO AND CAD
6 of 10 series · 6 of 30 positions shown · non-contrast
Comparison: Previous exam(s).

CLINICAL DATA: Screening.

EXAM:
DIGITAL SCREENING BILATERAL MAMMOGRAM WITH TOMOSYNTHESIS AND CAD
TECHNIQUE: Bilateral screening digital craniocaudal and mediolateral oblique
mammograms were obtained. Bilateral screening digital breast
tomosynthesis was performed. The images were evaluated with
computer-aided detection.

[L CC synth-2D]
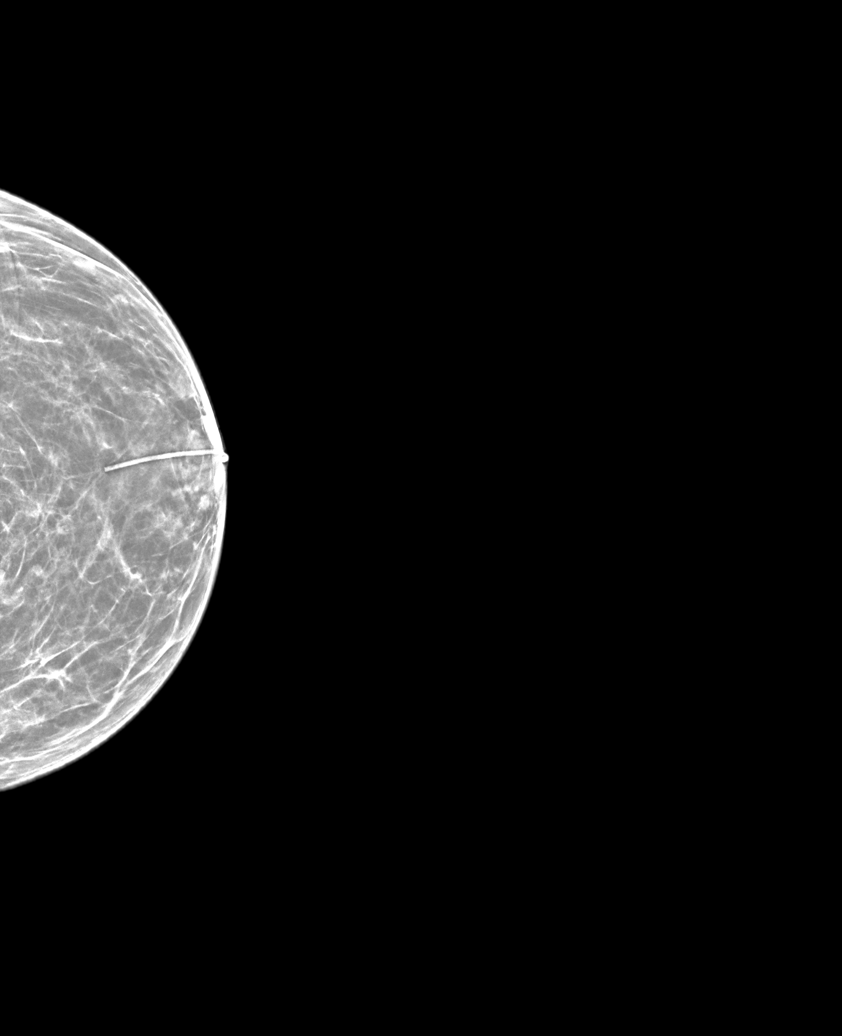

[L MLO synth-2D]
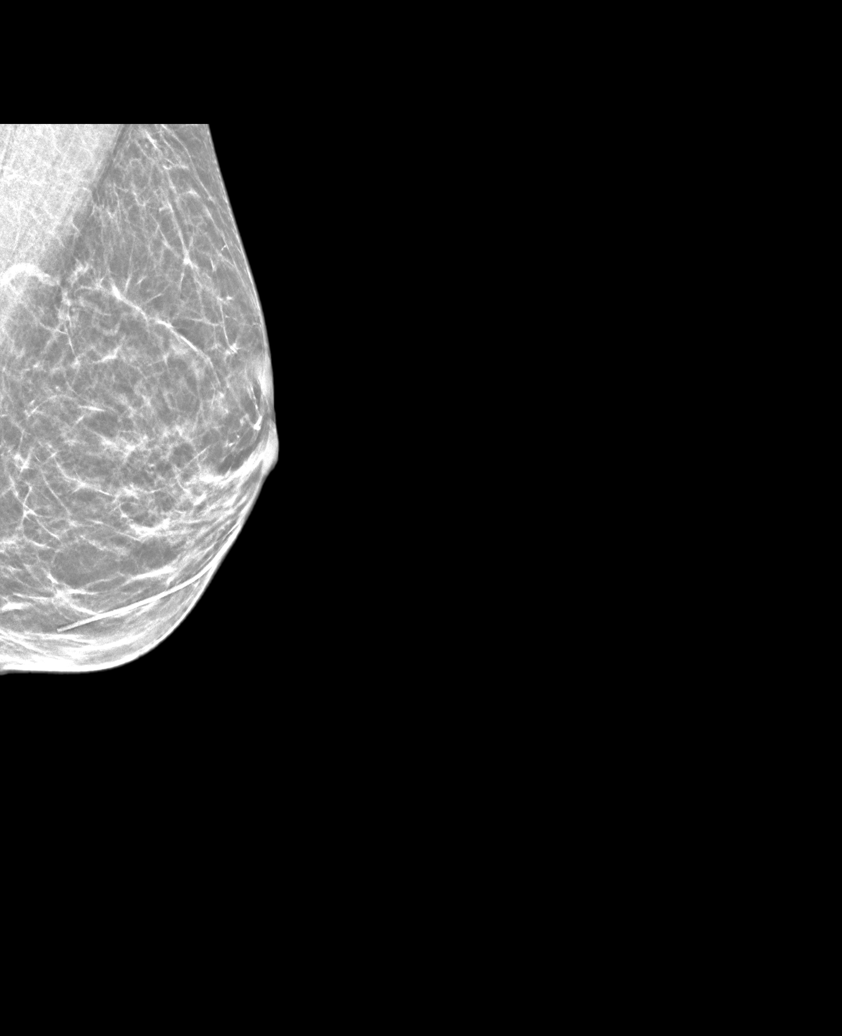

[R XCCL synth-2D]
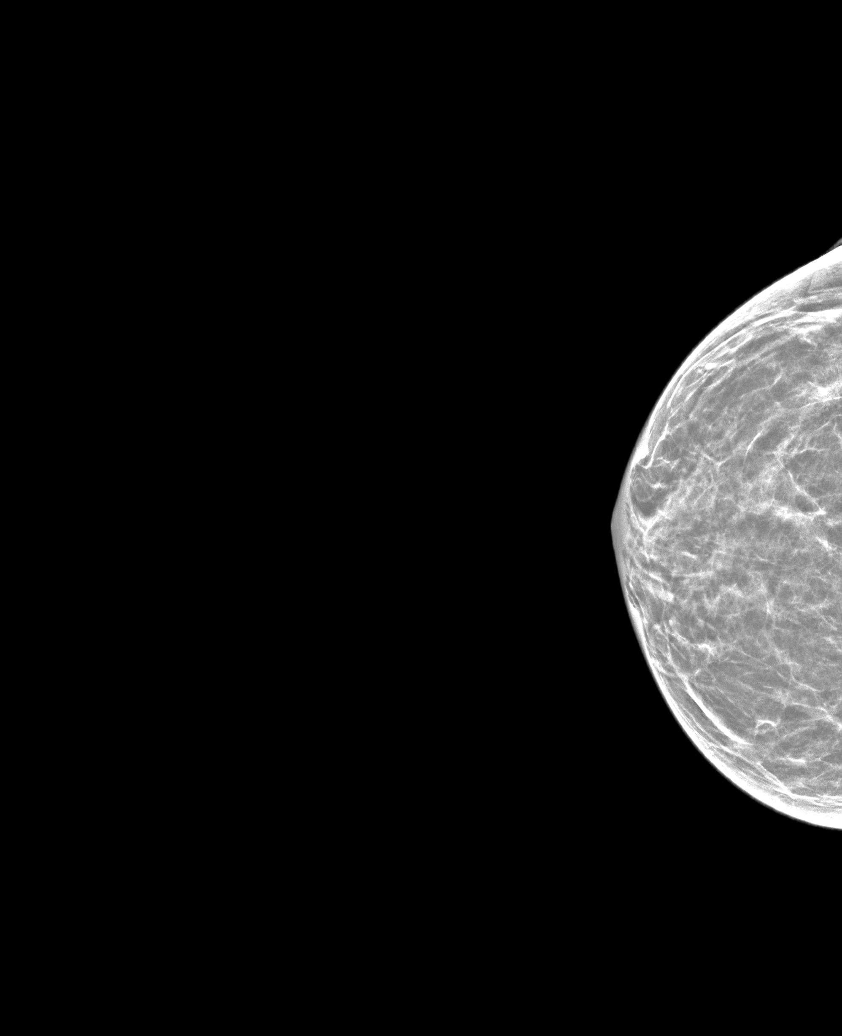

[R CC synth-2D]
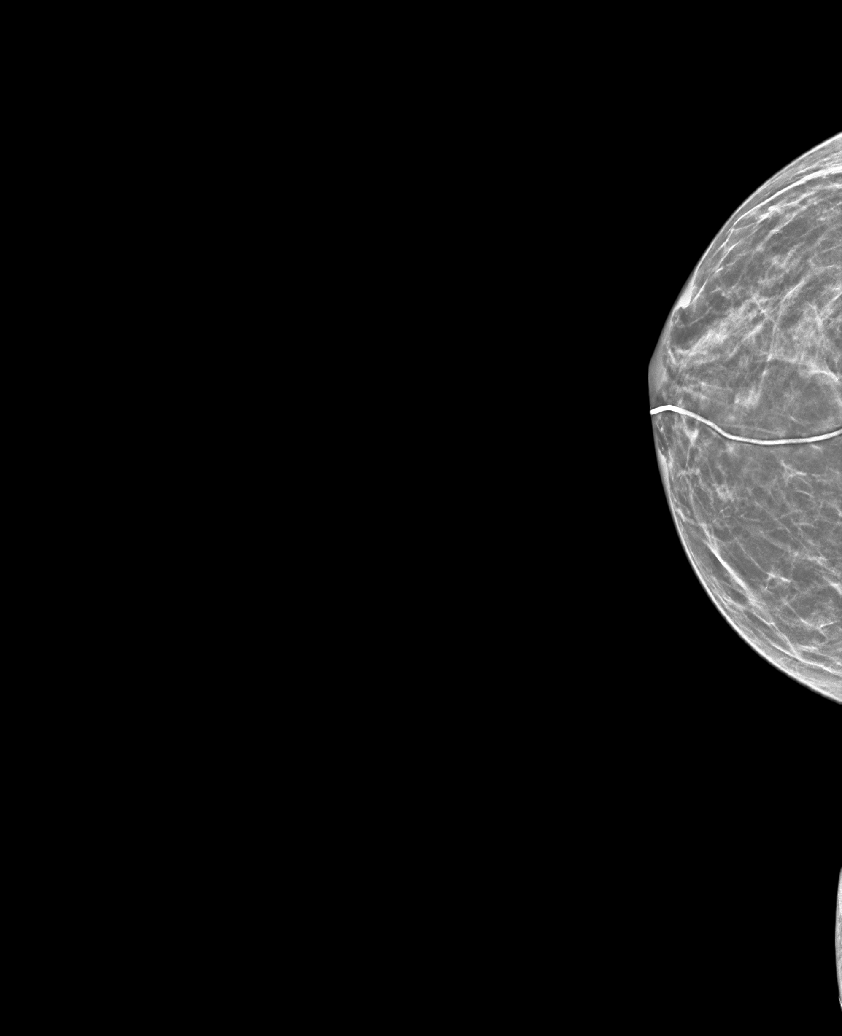

[R MLO synth-2D]
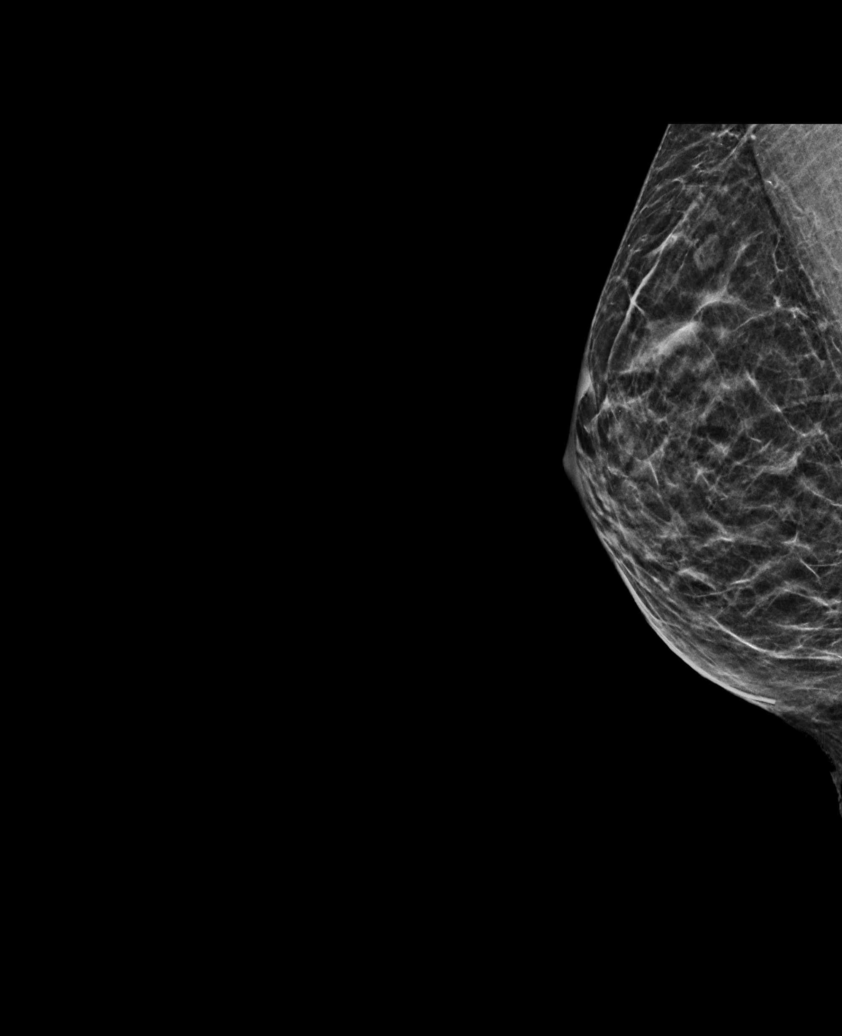

[R CC tomo · tomo slice 23/45.0]
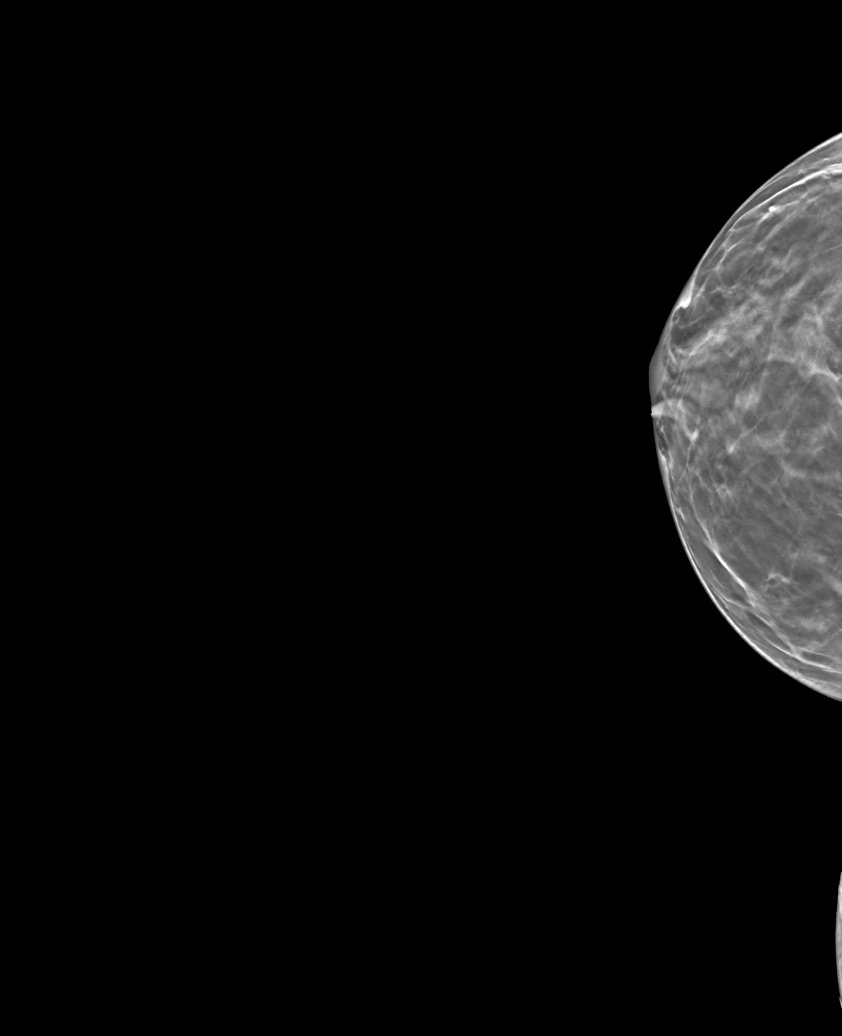

[6 of 30 positions shown; findings below may reference images not displayed]

ACR Breast Density Category b: There are scattered areas of
fibroglandular density.
FINDINGS: There are no findings suspicious for malignancy.
IMPRESSION: No mammographic evidence of malignancy. A result letter of this
screening mammogram will be mailed directly to the patient.

RECOMMENDATION:
Screening mammogram in one year. (Code:51-O-LD2)

BI-RADS CATEGORY  1: Negative.

## 2022-10-30 DIAGNOSIS — F4322 Adjustment disorder with anxiety: Secondary | ICD-10-CM | POA: Diagnosis not present

## 2022-11-05 DIAGNOSIS — F4322 Adjustment disorder with anxiety: Secondary | ICD-10-CM | POA: Diagnosis not present

## 2022-12-09 DIAGNOSIS — F4322 Adjustment disorder with anxiety: Secondary | ICD-10-CM | POA: Diagnosis not present

## 2022-12-24 DIAGNOSIS — N951 Menopausal and female climacteric states: Secondary | ICD-10-CM | POA: Diagnosis not present

## 2022-12-24 DIAGNOSIS — E039 Hypothyroidism, unspecified: Secondary | ICD-10-CM | POA: Diagnosis not present

## 2022-12-26 DIAGNOSIS — R232 Flushing: Secondary | ICD-10-CM | POA: Diagnosis not present

## 2022-12-26 DIAGNOSIS — Z79890 Hormone replacement therapy (postmenopausal): Secondary | ICD-10-CM | POA: Diagnosis not present

## 2022-12-26 DIAGNOSIS — N951 Menopausal and female climacteric states: Secondary | ICD-10-CM | POA: Diagnosis not present

## 2022-12-26 DIAGNOSIS — Z6824 Body mass index (BMI) 24.0-24.9, adult: Secondary | ICD-10-CM | POA: Diagnosis not present

## 2022-12-31 ENCOUNTER — Ambulatory Visit: Payer: BC Managed Care – PPO | Admitting: Family Medicine

## 2022-12-31 ENCOUNTER — Other Ambulatory Visit: Payer: Self-pay

## 2022-12-31 ENCOUNTER — Ambulatory Visit (INDEPENDENT_AMBULATORY_CARE_PROVIDER_SITE_OTHER): Payer: BC Managed Care – PPO

## 2022-12-31 ENCOUNTER — Encounter: Payer: Self-pay | Admitting: Family Medicine

## 2022-12-31 VITALS — BP 102/78 | HR 69 | Ht 63.0 in | Wt 135.0 lb

## 2022-12-31 DIAGNOSIS — G8929 Other chronic pain: Secondary | ICD-10-CM | POA: Diagnosis not present

## 2022-12-31 DIAGNOSIS — M25562 Pain in left knee: Secondary | ICD-10-CM

## 2022-12-31 NOTE — Progress Notes (Unsigned)
I, Stevenson Clinch, CMA acting as a scribe for Nichole Graham, MD.  Nichole Mccarty is a 57 y.o. female who presents to [facility] at [facility] today for L knee pain x 6 weeks. Heard a pop in the knee when tying to push off into a handstand. Locates pain to peripatellar region. Has tried ice, elevation and IBU q4h with minimal relief.   L Knee swelling: yes Mechanical symptoms: pop at time of injury Aggravates: WB Treatments tried: IBU, ice  Pertinent review of systems: No fevers or chills  Relevant historical information: History medial meniscus tear left knee.   Exam:  BP 102/78   Pulse 69   Ht 5\' 3"  (1.6 m)   Wt 135 lb (61.2 kg)   SpO2 94%   BMI 23.91 kg/m  General: Well Developed, well nourished, and in no acute distress.   MSK: Left knee mild effusion.  Normal-appearing otherwise. Tender palpation anterior and medial knee. Range of motion limited extension and flexion. Strength generally intact to extension and flexion although patient does experience pain with resisted knee extension. Some guarding with stability exam testing however patient does not have significant laxity to MCL or LCL stress test.  Too much guarding for anterior drawer test to be accurate. Antalgic gait.   Lab and Radiology Results  Procedure: Real-time Ultrasound Guided Injection of left knee joint superior lateral patella space Device: Philips Affiniti 50G/GE Logiq Images permanently stored and available for review in PACS Ultrasound evaluation prior to injection shows a mild joint effusion.  Intact patellar and quad tendon. Degenerative appearing medial joint line with partially extruded medial meniscus. Lateral joint line normal appearing. posterior knee no Baker's cyst. Verbal informed consent obtained.  Discussed risks and benefits of procedure. Warned about infection, bleeding, hyperglycemia damage to structures among others. Patient expresses understanding and  agreement Time-out conducted.   Noted no overlying erythema, induration, or other signs of local infection.   Skin prepped in a sterile fashion.   Local anesthesia: Topical Ethyl chloride.   With sterile technique and under real time ultrasound guidance: 40 mg of Kenalog and 2 mL of Marcaine injected into knee joint. Fluid seen entering the joint capsule.   Completed without difficulty   Pain immediately resolved suggesting accurate placement of the medication.   Advised to call if fevers/chills, erythema, induration, drainage, or persistent bleeding.   Images permanently stored and available for review in the ultrasound unit.  Impression: Technically successful ultrasound guided injection.   X-ray images left knee obtained today personally and independently interpreted *** Await formal radiology review    Assessment and Plan: 57 y.o. female with ***   PDMP not reviewed this encounter. Orders Placed This Encounter  Procedures   [place] LIMITED JOINT SPACE STRUCTURES LOW LEFT(NO LINKED CHARGES)    Order Specific Question:   Reason for Exam (SYMPTOM  OR DIAGNOSIS REQUIRED)    Answer:   left knee pain    Order Specific Question:   Preferred imaging location?    Answer:   [facility] Knee AP/LAT W/[place] Left    Standing Status:   Future    Standing Expiration Date:   01/31/2023    Order Specific Question:   Reason for Exam (SYMPTOM  OR DIAGNOSIS REQUIRED)    Answer:   left knee pain/injury    Order Specific Question:   Preferred imaging location?    Answer:   [facility]    Order Specific Question:  Is patient pregnant?    Answer:   No   Ambulatory referral to Physical Therapy    Referral Priority:   Routine    Referral Type:   Physical Medicine    Referral Reason:   Specialty Services Required    Requested Specialty:   Physical Therapy    Number of Visits Requested:   1   No orders of the defined types were placed in this  encounter.    Discussed warning signs or symptoms. Please see discharge instructions. Patient expresses understanding.   ***

## 2022-12-31 NOTE — Patient Instructions (Signed)
Thank you for coming in today.   You received an injection today. Seek immediate medical attention if the joint becomes red, extremely painful, or is oozing fluid.   Please get an Xray today before you leave   Please use Voltaren gel (Generic Diclofenac Gel) up to 4x daily for pain as needed.  This is available over-the-counter as both the name brand Voltaren gel and the generic diclofenac gel.

## 2023-01-01 ENCOUNTER — Encounter: Payer: Self-pay | Admitting: Family Medicine

## 2023-01-27 NOTE — Progress Notes (Signed)
Left knee x-ray shows a touch of arthritis.

## 2023-02-01 ENCOUNTER — Encounter: Payer: Self-pay | Admitting: Family Medicine

## 2023-02-18 ENCOUNTER — Other Ambulatory Visit: Payer: Self-pay | Admitting: Family Medicine

## 2023-02-18 DIAGNOSIS — F320 Major depressive disorder, single episode, mild: Secondary | ICD-10-CM

## 2023-02-27 DIAGNOSIS — F4322 Adjustment disorder with anxiety: Secondary | ICD-10-CM | POA: Diagnosis not present

## 2023-03-26 DIAGNOSIS — F4322 Adjustment disorder with anxiety: Secondary | ICD-10-CM | POA: Diagnosis not present

## 2023-05-03 ENCOUNTER — Encounter: Payer: Self-pay | Admitting: Family Medicine

## 2023-05-03 DIAGNOSIS — F320 Major depressive disorder, single episode, mild: Secondary | ICD-10-CM

## 2023-05-04 MED ORDER — PROGESTERONE MICRONIZED 100 MG PO CAPS: ORAL_CAPSULE | ORAL | 0 refills | Status: DC

## 2023-05-04 MED ORDER — ESCITALOPRAM OXALATE 10 MG PO TABS: 10.0000 mg | ORAL_TABLET | Freq: Every day | ORAL | 0 refills | Status: DC

## 2023-05-29 ENCOUNTER — Encounter: Payer: BC Managed Care – PPO | Admitting: Family Medicine

## 2023-06-11 ENCOUNTER — Other Ambulatory Visit: Payer: Self-pay | Admitting: Family Medicine

## 2023-06-17 DIAGNOSIS — E039 Hypothyroidism, unspecified: Secondary | ICD-10-CM | POA: Diagnosis not present

## 2023-06-17 DIAGNOSIS — N951 Menopausal and female climacteric states: Secondary | ICD-10-CM | POA: Diagnosis not present

## 2023-06-17 DIAGNOSIS — Z79890 Hormone replacement therapy (postmenopausal): Secondary | ICD-10-CM | POA: Diagnosis not present

## 2023-06-17 DIAGNOSIS — R232 Flushing: Secondary | ICD-10-CM | POA: Diagnosis not present

## 2023-06-19 DIAGNOSIS — N951 Menopausal and female climacteric states: Secondary | ICD-10-CM | POA: Diagnosis not present

## 2023-06-19 DIAGNOSIS — Z6825 Body mass index (BMI) 25.0-25.9, adult: Secondary | ICD-10-CM | POA: Diagnosis not present

## 2023-06-19 DIAGNOSIS — R232 Flushing: Secondary | ICD-10-CM | POA: Diagnosis not present

## 2023-06-19 DIAGNOSIS — R6882 Decreased libido: Secondary | ICD-10-CM | POA: Diagnosis not present

## 2023-06-22 NOTE — Progress Notes (Unsigned)
 [facility] at [facility] [address], [address] [place], [place] [address] [phone]  Date:  06/26/2023   Name:  Nichole Mccarty   DOB:  05/24/1965   MRN:  [id number]  PCP:  Kaylee Partridge, MD    Chief Complaint: No chief complaint on file.   History of Present Illness:  Nichole Mccarty is a 58 y.o. very pleasant female patient who presents with the following:  Pt seen today for a CPE Last seen by myself about one year ago - at that time she was using lexapro for mood and progesterone for sleep Generally in good health  Pap- 2021, can update  Mammo one year ago - ordered today Colon 2023- 3 polyps Shingrix done Tetanus UTD  Seen by Dr Alease Hunter last fall for knee pain   Her sodium was significantly low last year- I asked her to return for short term labs via mychart and her cell- did not return for labs.  We were not able to reach her on the phone and sent a letter - we can repeat her sodium today   They did get a mountain house in [place], [place] which they are really enjoying  Her oldest daughter was married last year  She enjoys weight training and hot yoga  No PMB   Patient Active Problem List   Diagnosis Date Noted   Loss of transverse plantar arch 02/10/2020   Acute medial meniscal tear, left, initial encounter 07/02/2017   Tear of MCL (medial collateral ligament) of knee, right, initial encounter 05/07/2017    Past Medical History:  Diagnosis Date   Allergy    seasonal   Anxiety     Past Surgical History:  Procedure Laterality Date   COLONOSCOPY  06/05/2021   jaw surgey     age 63   WISDOM TOOTH EXTRACTION      Social History   Tobacco Use   Smoking status: Never   Smokeless tobacco: Never  Vaping Use   Vaping status: Never Used  Substance Use Topics   Alcohol use: Yes    Comment: couple drinks 2 x per week   Drug use: No    Family History  Problem Relation Age of Onset   Colon cancer Neg Hx     Esophageal cancer Neg Hx    Colon polyps Neg Hx    Rectal cancer Neg Hx    Stomach cancer Neg Hx     No Known Allergies  Medication list has been reviewed and updated.  Current Outpatient Medications on File Prior to Visit  Medication Sig Dispense Refill   Calcium Carbonate (CALCIUM 600 PO) Take 1 tablet by mouth daily.     escitalopram (LEXAPRO) 10 MG tablet Take 1 tablet (10 mg total) by mouth daily. 30 tablet 0   mirabegron ER (MYRBETRIQ) 25 MG TB24 tablet Take 1 tablet (25 mg total) by mouth daily. (Patient taking differently: Take 25 mg by mouth as needed.) 30 tablet 2   Multiple Vitamin (MULTIVITAMIN PO) Take by mouth daily. Women  take one tablet     Omega-3 Fatty Acids (FISH OIL PO) Take by mouth daily. Take one capsule daily     progesterone (PROMETRIUM) 100 MG capsule TAKE 1 CAPSULE BY MOUTH AT BEDTIME 90 capsule 3   No current facility-administered medications on file prior to visit.    Review of Systems:  As per HPI- otherwise negative.   Physical Examination: Vitals:  06/26/23 0842  BP: 106/66  Pulse: 66  Resp: 16  Temp: 98.6 F (37 C)  SpO2: 100%   Vitals:   06/26/23 0842  Weight: 136 lb (61.7 kg)  Height: 5\' 3"  (1.6 m)   Body mass index is 24.09 kg/m. Ideal Body Weight: Weight in (lb) to have BMI = 25: 140.8  GEN: no acute distress. Normal weight, looks well  HEENT: Atraumatic, Normocephalic. Bilateral TM wnl, oropharynx normal.  PEERL,EOMI.    Ears and Nose: No external deformity. CV: RRR, No M/G/R. No JVD. No thrill. No extra heart sounds. PULM: CTA B, no wheezes, crackles, rhonchi. No retractions. No resp. distress. No accessory muscle use. ABD: S, NT, ND, +BS. No rebound. No HSM. EXTR: No c/c/e PSYCH: Normally interactive. Conversant.  Breast: normal exam, no masses/ dimpling/ discharge Pelvic: normal, no vaginal lesions or discharge. Uterus normal, no CMT   Assessment and Plan: Physical exam  Depression, major, single episode, mild  (H[facility]) - Plan: escitalopram (LEXAPRO) 10 MG tablet  Screening for diabetes mellitus - Plan: Comprehensive metabolic panel with GFR, Hemoglobin A1c  Thyroid disorder screening - Plan: TSH  Screening, lipid - Plan: Lipid panel  Hyponatremia - Plan: Comprehensive metabolic panel with GFR  Screening for deficiency anemia - Plan: CBC  Encounter for screening mammogram for malignant neoplasm of breast - Plan: MM 3D SCREENING MAMMOGRAM BILATERAL BREAST  Screening for cervical cancer - Plan: Cytology - PAP  Physical exam today Encouraged healthy diet and exercise routine Pap completed, mammogram ordered Will follow-up on her hyponatremia today  Signed Gates Kasal, MD  Received labs as below, message to patient  Results for orders placed or performed in visit on 06/26/23  CBC   Collection Time: 06/26/23  9:09 AM  Result Value Ref Range   WBC 2.7 (L) 4.0 - 10.5 K/uL   RBC 4.21 3.87 - 5.11 Mil/uL   Platelets 317.0 150.0 - 400.0 K/uL   Hemoglobin 13.2 12.0 - 15.0 g/dL   HCT 16.1 09.6 - 04.5 %   MCV 92.2 78.0 - 100.0 fl   MCHC 33.9 30.0 - 36.0 g/dL   RDW 40.9 81.1 - 91.4 %  Comprehensive metabolic panel with GFR   Collection Time: 06/26/23  9:09 AM  Result Value Ref Range   Sodium 136 135 - 145 mEq/L   Potassium 4.1 3.5 - 5.1 mEq/L   Chloride 102 96 - 112 mEq/L   CO2 28 19 - 32 mEq/L   Glucose, Bld 96 70 - 99 mg/dL   BUN 16 6 - 23 mg/dL   Creatinine, Ser 7.82 0.40 - 1.20 mg/dL   Total Bilirubin 0.7 0.2 - 1.2 mg/dL   Alkaline Phosphatase 36 (L) 39 - 117 U/L   AST 19 0 - 37 U/L   ALT 17 0 - 35 U/L   Total Protein 6.5 6.0 - 8.3 g/dL   Albumin 4.7 3.5 - 5.2 g/dL   GFR 95.62 >13.08 mL/min   Calcium 9.1 8.4 - 10.5 mg/dL  Hemoglobin M5H   Collection Time: 06/26/23  9:09 AM  Result Value Ref Range   Hgb A1c MFr Bld 5.4 4.6 - 6.5 %  Lipid panel   Collection Time: 06/26/23  9:09 AM  Result Value Ref Range   Cholesterol 191 0 - 200 mg/dL   Triglycerides 84.6 0.0 - 149.0  mg/dL   HDL 96.29 >52.84 mg/dL   VLDL 13.2 0.0 - 44.0 mg/dL   LDL Cholesterol 102 (H) 0 - 99 mg/dL   Total  CHOL/HDL Ratio 3    NonHDL 117.94   TSH   Collection Time: 06/26/23  9:09 AM  Result Value Ref Range   TSH 1.21 0.35 - 5.50 uIU/mL   The 10-year ASCVD risk score (Arnett DK, et al., 2019) is: 1.4%   Values used to calculate the score:     Age: 44 years     Sex: Female     Is Non-Hispanic African American: No     Diabetic: No     Tobacco smoker: No     Systolic Blood Pressure: 106 mmHg     Is BP treated: No     HDL Cholesterol: 73.4 mg/dL     Total Cholesterol: 191 mg/dL

## 2023-06-22 NOTE — Patient Instructions (Signed)
Good to see you today!  

## 2023-06-26 ENCOUNTER — Encounter: Payer: Self-pay | Admitting: Family Medicine

## 2023-06-26 ENCOUNTER — Ambulatory Visit (INDEPENDENT_AMBULATORY_CARE_PROVIDER_SITE_OTHER): Admitting: Family Medicine

## 2023-06-26 ENCOUNTER — Other Ambulatory Visit (HOSPITAL_COMMUNITY)
Admission: RE | Admit: 2023-06-26 | Discharge: 2023-06-26 | Disposition: A | Discharge status: Home / Self Care | Source: Ambulatory Visit | Attending: Family Medicine | Admitting: Family Medicine

## 2023-06-26 VITALS — BP 106/66 | HR 66 | Temp 98.6°F | Resp 16 | Ht 63.0 in | Wt 136.0 lb

## 2023-06-26 DIAGNOSIS — Z1329 Encounter for screening for other suspected endocrine disorder: Secondary | ICD-10-CM | POA: Diagnosis not present

## 2023-06-26 DIAGNOSIS — Z0000 Encounter for general adult medical examination without abnormal findings: Secondary | ICD-10-CM | POA: Diagnosis not present

## 2023-06-26 DIAGNOSIS — Z124 Encounter for screening for malignant neoplasm of cervix: Secondary | ICD-10-CM

## 2023-06-26 DIAGNOSIS — Z131 Encounter for screening for diabetes mellitus: Secondary | ICD-10-CM | POA: Diagnosis not present

## 2023-06-26 DIAGNOSIS — Z130 Encounter for screening for diseases of the blood and blood-forming organs and certain disorders involving the immune mechanism: Secondary | ICD-10-CM

## 2023-06-26 DIAGNOSIS — Z13220 Encounter for screening for lipoid disorders: Secondary | ICD-10-CM

## 2023-06-26 DIAGNOSIS — Z1231 Encounter for screening mammogram for malignant neoplasm of breast: Secondary | ICD-10-CM

## 2023-06-26 DIAGNOSIS — E871 Hypo-osmolality and hyponatremia: Secondary | ICD-10-CM

## 2023-06-26 DIAGNOSIS — F320 Major depressive disorder, single episode, mild: Secondary | ICD-10-CM

## 2023-06-26 LAB — CBC
HCT: 38.9 % (ref 36.0–46.0)
Hemoglobin: 13.2 g/dL (ref 12.0–15.0)
MCHC: 33.9 g/dL (ref 30.0–36.0)
MCV: 92.2 fl (ref 78.0–100.0)
Platelets: 317 10*3/uL (ref 150.0–400.0)
RBC: 4.21 Mil/uL (ref 3.87–5.11)
RDW: 12.8 % (ref 11.5–15.5)
WBC: 2.7 10*3/uL — ABNORMAL LOW (ref 4.0–10.5)

## 2023-06-26 LAB — LIPID PANEL
Cholesterol: 191 mg/dL (ref 0–200)
HDL: 73.4 mg/dL (ref >39.00)
LDL Cholesterol: 108 mg/dL — ABNORMAL HIGH (ref 0–99)
NonHDL: 117.94
Total CHOL/HDL Ratio: 3
Triglycerides: 50 mg/dL (ref 0.0–149.0)
VLDL: 10 mg/dL (ref 0.0–40.0)

## 2023-06-26 LAB — COMPREHENSIVE METABOLIC PANEL WITH GFR
ALT: 17 U/L (ref 0–35)
AST: 19 U/L (ref 0–37)
Albumin: 4.7 g/dL (ref 3.5–5.2)
Alkaline Phosphatase: 36 U/L — ABNORMAL LOW (ref 39–117)
BUN: 16 mg/dL (ref 6–23)
CO2: 28 meq/L (ref 19–32)
Calcium: 9.1 mg/dL (ref 8.4–10.5)
Chloride: 102 meq/L (ref 96–112)
Creatinine, Ser: 0.72 mg/dL (ref 0.40–1.20)
GFR: 92.19 mL/min (ref >60.00)
Glucose, Bld: 96 mg/dL (ref 70–99)
Potassium: 4.1 meq/L (ref 3.5–5.1)
Sodium: 136 meq/L (ref 135–145)
Total Bilirubin: 0.7 mg/dL (ref 0.2–1.2)
Total Protein: 6.5 g/dL (ref 6.0–8.3)

## 2023-06-26 LAB — TSH: TSH: 1.21 u[IU]/mL (ref 0.35–5.50)

## 2023-06-26 LAB — HEMOGLOBIN A1C: Hgb A1c MFr Bld: 5.4 % (ref 4.6–6.5)

## 2023-06-26 MED ORDER — ESCITALOPRAM OXALATE 10 MG PO TABS: 10.0000 mg | ORAL_TABLET | Freq: Every day | ORAL | 3 refills | Status: DC

## 2023-06-27 ENCOUNTER — Encounter: Payer: Self-pay | Admitting: Family Medicine

## 2023-06-27 LAB — CYTOLOGY - PAP
Adequacy: ABSENT
Comment: NEGATIVE
Diagnosis: NEGATIVE
High risk HPV: NEGATIVE

## 2023-07-11 ENCOUNTER — Other Ambulatory Visit: Payer: Self-pay | Admitting: Family Medicine

## 2023-07-15 ENCOUNTER — Inpatient Hospital Stay (HOSPITAL_BASED_OUTPATIENT_CLINIC_OR_DEPARTMENT_OTHER): Admission: RE | Admit: 2023-07-15 | Source: Ambulatory Visit | Admitting: Radiology

## 2023-07-29 ENCOUNTER — Encounter (HOSPITAL_BASED_OUTPATIENT_CLINIC_OR_DEPARTMENT_OTHER): Payer: Self-pay | Admitting: Radiology

## 2023-07-29 ENCOUNTER — Ambulatory Visit (HOSPITAL_BASED_OUTPATIENT_CLINIC_OR_DEPARTMENT_OTHER)
Admission: RE | Admit: 2023-07-29 | Discharge: 2023-07-29 | Disposition: A | Discharge status: Home / Self Care | Source: Ambulatory Visit | Attending: Family Medicine | Admitting: Family Medicine

## 2023-07-29 DIAGNOSIS — Z1231 Encounter for screening mammogram for malignant neoplasm of breast: Secondary | ICD-10-CM | POA: Diagnosis not present

## 2023-08-28 ENCOUNTER — Ambulatory Visit: Admitting: Family Medicine

## 2023-09-09 ENCOUNTER — Encounter: Payer: Self-pay | Admitting: Family Medicine

## 2023-09-09 ENCOUNTER — Other Ambulatory Visit: Payer: Self-pay

## 2023-09-09 ENCOUNTER — Ambulatory Visit: Admitting: Family Medicine

## 2023-09-09 VITALS — BP 110/80 | HR 63 | Ht 63.0 in | Wt 136.0 lb

## 2023-09-09 DIAGNOSIS — G8929 Other chronic pain: Secondary | ICD-10-CM

## 2023-09-09 DIAGNOSIS — M25562 Pain in left knee: Secondary | ICD-10-CM | POA: Diagnosis not present

## 2023-09-09 NOTE — Progress Notes (Unsigned)
   I, Leotis Batter, CMA acting as a scribe for Artist Lloyd, MD.  Nichole Mccarty is a 58 y.o. female who presents to [facility] at [facility] today for exacerbation of her L knee pain. Pt was last seen by Dr. Lloyd on 12/31/22 and was given a L knee steroid injection and advised to use Voltaren  gel and hinged knee brace. Also referred to PT, but no visits were scheduled.  Today, pt reports exacerbation of left knee sx over the past 2 weeks, no injury. Does Yoga regularly. Denies swelling. Locates pain to lateral aspect of the knee. Denies mechanical sx. Sx worse first thing in the morning, aching. Has a couple of hikes planned for the weekend. Denies night disturbance. Has taken IBU, reduced weights at the gym.   Dx imaging: 12/31/22 L knee XR  Pertinent review of systems: No fevers or chills  Relevant historical information: Anxiety disorder. History of a meniscus tear left knee.  Exam:  BP 110/80   Pulse 63   Ht 5' 3 (1.6 m)   Wt 136 lb (61.7 kg)   SpO2 97%   BMI 24.09 kg/m  General: Well Developed, well nourished, and in no acute distress.   MSK: Left knee: Mild effusion otherwise normal-appearing Normal motion. Intact strength. Stable ligamentous exam.    Lab and Radiology Results  Procedure: Real-time Ultrasound Guided Injection of left knee joint superior lateral patella space Device: Philips Affiniti 50G/GE Logiq Images permanently stored and available for review in PACS Verbal informed consent obtained.  Discussed risks and benefits of procedure. Warned about infection, bleeding, hyperglycemia damage to structures among others. Patient expresses understanding and agreement Time-out conducted.   Noted no overlying erythema, induration, or other signs of local infection.   Skin prepped in a sterile fashion.   Local anesthesia: Topical Ethyl chloride.   With sterile technique and under real time ultrasound guidance: 40 mg of Kenalog  and 2 mL of  Marcaine injected into knee joint. Fluid seen entering the joint capsule.   Completed without difficulty   Pain immediately resolved suggesting accurate placement of the medication.   Advised to call if fevers/chills, erythema, induration, drainage, or persistent bleeding.   Images permanently stored and available for review in the ultrasound unit.  Impression: Technically successful ultrasound guided injection.       Assessment and Plan: 58 y.o. female with left knee pain due to exacerbation of DJD and degenerative meniscus tear.  Doing pretty well.  Previous injection was about 8-9 months ago.  Plan for repeat injection today and continue activity as tolerated with weightbearing exercise.  Check back as needed.   PDMP not reviewed this encounter. Orders Placed This Encounter  Procedures   US  LIMITED JOINT SPACE STRUCTURES LOW LEFT(NO LINKED CHARGES)    Reason for Exam (SYMPTOM  OR DIAGNOSIS REQUIRED):   left knee pain    Preferred imaging location?:   [facility]   No orders of the defined types were placed in this encounter.    Discussed warning signs or symptoms. Please see discharge instructions. Patient expresses understanding.   The above documentation has been reviewed and is accurate and complete Artist Lloyd, M.D.

## 2023-09-09 NOTE — Patient Instructions (Addendum)
 Thank you for coming in today.   You received an injection today. Seek immediate medical attention if the joint becomes red, extremely painful, or is oozing fluid.   Have a great hiking trip!  See you back as needed.

## 2023-09-10 DIAGNOSIS — Z85828 Personal history of other malignant neoplasm of skin: Secondary | ICD-10-CM | POA: Insufficient documentation

## 2023-09-10 DIAGNOSIS — D225 Melanocytic nevi of trunk: Secondary | ICD-10-CM | POA: Insufficient documentation

## 2023-09-28 ENCOUNTER — Other Ambulatory Visit: Payer: Self-pay | Admitting: Family Medicine

## 2023-09-28 DIAGNOSIS — F320 Major depressive disorder, single episode, mild: Secondary | ICD-10-CM

## 2023-10-10 ENCOUNTER — Encounter: Payer: Self-pay | Admitting: Family Medicine

## 2023-10-10 DIAGNOSIS — F320 Major depressive disorder, single episode, mild: Secondary | ICD-10-CM

## 2023-10-10 MED ORDER — ESCITALOPRAM OXALATE 10 MG PO TABS: 10.0000 mg | ORAL_TABLET | Freq: Every day | ORAL | 0 refills | Status: DC

## 2023-10-10 MED ORDER — PROGESTERONE MICRONIZED 100 MG PO CAPS: 100.0000 mg | ORAL_CAPSULE | Freq: Every day | ORAL | 0 refills | Status: AC

## 2023-10-28 ENCOUNTER — Encounter: Payer: Self-pay | Admitting: Family Medicine

## 2024-01-01 ENCOUNTER — Other Ambulatory Visit: Payer: Self-pay | Admitting: Family Medicine

## 2024-01-01 DIAGNOSIS — F320 Major depressive disorder, single episode, mild: Secondary | ICD-10-CM

## 2024-01-07 DIAGNOSIS — L578 Other skin changes due to chronic exposure to nonionizing radiation: Secondary | ICD-10-CM | POA: Diagnosis not present

## 2024-01-07 DIAGNOSIS — D225 Melanocytic nevi of trunk: Secondary | ICD-10-CM | POA: Diagnosis not present

## 2024-01-07 DIAGNOSIS — L814 Other melanin hyperpigmentation: Secondary | ICD-10-CM | POA: Diagnosis not present

## 2024-01-07 DIAGNOSIS — L570 Actinic keratosis: Secondary | ICD-10-CM | POA: Diagnosis not present

## 2024-01-07 DIAGNOSIS — L821 Other seborrheic keratosis: Secondary | ICD-10-CM | POA: Diagnosis not present

## 2024-01-20 ENCOUNTER — Encounter: Payer: Self-pay | Admitting: Family Medicine

## 2024-01-22 DIAGNOSIS — F419 Anxiety disorder, unspecified: Secondary | ICD-10-CM | POA: Diagnosis not present

## 2024-01-22 DIAGNOSIS — F338 Other recurrent depressive disorders: Secondary | ICD-10-CM | POA: Diagnosis not present

## 2024-01-22 DIAGNOSIS — R41840 Attention and concentration deficit: Secondary | ICD-10-CM | POA: Diagnosis not present

## 2024-02-08 ENCOUNTER — Encounter: Payer: Self-pay | Admitting: Family Medicine

## 2024-02-09 ENCOUNTER — Ambulatory Visit: Admitting: Medical

## 2024-02-09 VITALS — BP 118/82 | HR 67 | Temp 98.0°F | Resp 15 | Ht 63.0 in | Wt 139.0 lb

## 2024-02-09 DIAGNOSIS — F320 Major depressive disorder, single episode, mild: Secondary | ICD-10-CM

## 2024-02-09 DIAGNOSIS — L089 Local infection of the skin and subcutaneous tissue, unspecified: Secondary | ICD-10-CM

## 2024-02-09 DIAGNOSIS — B001 Herpesviral vesicular dermatitis: Secondary | ICD-10-CM | POA: Diagnosis not present

## 2024-02-09 MED ORDER — CEPHALEXIN 500 MG PO CAPS: 500.0000 mg | ORAL_CAPSULE | Freq: Two times a day (BID) | ORAL | 0 refills | Status: DC

## 2024-02-09 MED ORDER — ESCITALOPRAM OXALATE 20 MG PO TABS: 20.0000 mg | ORAL_TABLET | Freq: Every day | ORAL | 3 refills | Status: DC

## 2024-02-09 MED ORDER — FAMCICLOVIR 500 MG PO TABS: 500.0000 mg | ORAL_TABLET | Freq: Three times a day (TID) | ORAL | 0 refills | Status: DC

## 2024-02-09 NOTE — Progress Notes (Signed)
 Subjective:    Patient ID: Nichole Mccarty, female    DOB: [birth year], 58 y.o.   MRN: [id number]  HPI Nichole Mccarty is a 58 year old female who presents with severe cold sores and potential secondary infection on the lower lip.  She developed symptoms after starting acyclovir while traveling. She initially took acyclovir 200 mg three times daily from the Thursday before Thanksgiving through Sunday, then stopped until Wednesday, when she restarted it every four hours through today.  Cold sores are severe with left sweling and pain limiting eating Lesions are on the left lower and upper lip. The left lower lip is crusty and appears possibly infected. Scabs form overnight but come off each morning, with poor healing.  She has recurrent cold sores usually triggered by sun, but this episode is more severe. She has been using Aquaphor, a pharmacist-recommended paste, and benzocaine for pain with limited relief.      Review of Systems  Constitutional:  Negative for chills and fever.  HENT:  Negative for congestion, hearing loss, postnasal drip and sinus pain.        Lips-see hpi  Respiratory:  Negative for chest tightness and shortness of breath.   Cardiovascular:  Negative for chest pain and palpitations.  Gastrointestinal:  Negative for abdominal pain.  Skin:  Negative for rash.       See hpi and exam  Neurological:  Negative for dizziness, speech difficulty and weakness.  Hematological:  Negative for adenopathy.  Psychiatric/Behavioral:  Negative for behavioral problems and dysphoric mood.     Past Medical History:  Diagnosis Date   Allergy    seasonal   Anxiety      Social History   Socioeconomic History   Marital status: Single    Spouse name: Not on file   Number of children: Not on file   Years of education: Not on file   Highest education level: Not on file  Occupational History   Occupation: Sales  Tobacco Use   Smoking status: Never   Smokeless tobacco: Never   Vaping Use   Vaping status: Never Used  Substance and Sexual Activity   Alcohol use: Yes    Comment: couple drinks 2 x per week   Drug use: No   Sexual activity: Yes    Birth control/protection: Post-menopausal  Other Topics Concern   Not on file  Social History Narrative   Not on file   Social Drivers of Health   Financial Resource Strain: Not on file  Food Insecurity: Not on file  Transportation Needs: Not on file  Physical Activity: Not on file  Stress: Not on file  Social Connections: Not on file  Intimate Partner Violence: Not on file    Past Surgical History:  Procedure Laterality Date   COLONOSCOPY  06/05/2021   jaw surgey     age 66   WISDOM TOOTH EXTRACTION      Family History  Problem Relation Age of Onset   Colon cancer Neg Hx    Esophageal cancer Neg Hx    Colon polyps Neg Hx    Rectal cancer Neg Hx    Stomach cancer Neg Hx     No Known Allergies  Current Outpatient Medications on File Prior to Visit  Medication Sig Dispense Refill   Calcium Carbonate (CALCIUM 600 PO) Take 1 tablet by mouth daily.     escitalopram  (LEXAPRO ) 10 MG tablet Take 1 tablet (10 mg total) by mouth daily. 90 tablet 1  mirabegron  ER (MYRBETRIQ ) 25 MG TB24 tablet Take 1 tablet (25 mg total) by mouth daily. (Patient taking differently: Take 25 mg by mouth as needed.) 30 tablet 2   Multiple Vitamin (MULTIVITAMIN PO) Take by mouth daily. Women  take one tablet     Omega-3 Fatty Acids (FISH OIL PO) Take by mouth daily. Take one capsule daily     progesterone  (PROMETRIUM ) 100 MG capsule Take 1 capsule (100 mg total) by mouth at bedtime. 14 capsule 0   No current facility-administered medications on file prior to visit.    BP 118/82   Pulse 67   Temp 98 F (36.7 C) (Oral)   Resp 15   Ht 5' 3 (1.6 m)   Wt 139 lb (63 kg)   SpO2 99%   BMI 24.62 kg/m         Objective:   Physical Exam  General- No acute distress. Pleasant patient. Neck- Full range of motion, no  jvd Lungs- Clear, even and unlabored. Heart- regular rate and rhythm. Neurologic- CNII- XII grossly intact.  HEENT: Left lower lip with crusty appearance, yellowish brown coloration, potential secondary infection(left side of lower lip swollen). Upper lip with 2 smaller cold sores.      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 - Discontinued acyclovir. - Prescribed Famvir 500 mg TID for 7 days.(Dc acyclovir. Continue antivrial based on duration of sores and size) - Prescribed Keflex 500 mg BID for 7 days. - Recommended chapstick with vitamin E for lip moisturizing. - Scheduled follow-up in 7 days or sooner if needed.     Dallas Maxwell, PA-C   Pt also wants to continue lexapro  20 mg daily. She increased per Dr. Watt advise.

## 2024-02-09 NOTE — Patient Instructions (Addendum)
 Recurrent herpes labialis  in both upper lip and lower lipi with secondary bacterial infection of left lower lip Persistent lesions with atypical appearance on left lower lip suggest secondary bacterial infection. - Discontinued acyclovir. - Prescribed Famvir 500 mg TID for 7 days.(Dc acyclovir. Continue antivrial based on duration of sores and size) - Prescribed Keflex 500 mg BID for 7 days. - Recommended chapstick with vitamin E for lip moisturizing. - Scheduled follow-up in 7 days or sooner if needed.

## 2024-02-15 NOTE — Progress Notes (Unsigned)
 [facility] [address], [address] [place], [place] [address] [phone]  Date:  02/18/2024   Name:  Nichole Mccarty   DOB:  05-Jan-1966   MRN:  [id number]  PCP:  Watt Harlene BROCKS, MD    Chief Complaint: No chief complaint on file.   History of Present Illness:  Nichole Mccarty is a 58 y.o. very pleasant female patient who presents with the following:  Patient seen today for short-term follow-up She saw my partner Dallas Maxwell on 12/1 with severe HSV of her lips with secondary bacterial infection He started her on Famvir , Keflex   I saw her most recently for her physical in April She is generally in good health  Flu vaccine Recommend pneumococcal vaccination  Discussed the use of AI scribe software for clinical note transcription with the patient, who gave verbal consent to proceed.  History of Present Illness     Patient Active Problem List   Diagnosis Date Noted   History of basal cell carcinoma (BCC) 09/10/2023   Melanocytic nevus of trunk 09/10/2023   Loss of transverse plantar arch 02/10/2020   Anxiety disorder 04/06/2018   Acute medial meniscal tear, left, initial encounter 07/02/2017   Tear of MCL (medial collateral ligament) of knee, right, initial encounter 05/07/2017    Past Medical History:  Diagnosis Date   Allergy    seasonal   Anxiety     Past Surgical History:  Procedure Laterality Date   COLONOSCOPY  06/05/2021   jaw surgey     age 58   WISDOM TOOTH EXTRACTION      Social History   Tobacco Use   Smoking status: Never   Smokeless tobacco: Never  Vaping Use   Vaping status: Never Used  Substance Use Topics   Alcohol use: Yes    Comment: couple drinks 2 x per week   Drug use: No    Family History  Problem Relation Age of Onset   Colon cancer Neg Hx    Esophageal cancer Neg Hx    Colon polyps Neg Hx    Rectal cancer Neg Hx    Stomach cancer Neg Hx     No Known  Allergies  Medication list has been reviewed and updated.  Current Outpatient Medications on File Prior to Visit  Medication Sig Dispense Refill   Calcium Carbonate (CALCIUM 600 PO) Take 1 tablet by mouth daily.     cephALEXin  (KEFLEX ) 500 MG capsule Take 1 capsule (500 mg total) by mouth 2 (two) times daily. 14 capsule 0   escitalopram  (LEXAPRO ) 10 MG tablet Take 1 tablet (10 mg total) by mouth daily. 90 tablet 1   escitalopram  (LEXAPRO ) 20 MG tablet Take 1 tablet (20 mg total) by mouth daily. 90 tablet 3   famciclovir  (FAMVIR ) 500 MG tablet Take 1 tablet (500 mg total) by mouth 3 (three) times daily. 21 tablet 0   mirabegron  ER (MYRBETRIQ ) 25 MG TB24 tablet Take 1 tablet (25 mg total) by mouth daily. (Patient taking differently: Take 25 mg by mouth as needed.) 30 tablet 2   Multiple Vitamin (MULTIVITAMIN PO) Take by mouth daily. Women  take one tablet     Omega-3 Fatty Acids (FISH OIL PO) Take by mouth daily. Take one capsule daily     progesterone  (PROMETRIUM ) 100 MG capsule Take 1 capsule (100 mg total) by mouth at bedtime. 14 capsule 0   No current facility-administered medications on file prior  to visit.    Review of Systems:  As per HPI- otherwise negative.   Physical Examination: There were no vitals filed for this visit. There were no vitals filed for this visit. There is no height or weight on file to calculate BMI. Ideal Body Weight:    GEN: no acute distress. HEENT: Atraumatic, Normocephalic.  Ears and Nose: No external deformity. CV: RRR, No M/G/R. No JVD. No thrill. No extra heart sounds. PULM: CTA B, no wheezes, crackles, rhonchi. No retractions. No resp. distress. No accessory muscle use. ABD: S, NT, ND, +BS. No rebound. No HSM. EXTR: No c/c/e PSYCH: Normally interactive. Conversant.    Assessment and Plan: No diagnosis found.  Assessment & Plan   Signed Harlene Schroeder, MD

## 2024-02-15 NOTE — Progress Notes (Deleted)
 [facility] [address], [address] [place], [place] [address] [phone]  Date:  02/16/2024   Name:  Nichole Mccarty   DOB:  22-Aug-1965   MRN:  [id number]  PCP:  Watt Harlene BROCKS, MD    Chief Complaint: No chief complaint on file.   History of Present Illness:  Nichole Mccarty is a 58 y.o. very pleasant female patient who presents with the following:  Patient seen today for short-term follow-up She saw my partner Dallas Maxwell on 12/1 with severe HSV of her lips with secondary bacterial infection He started her on Famvir , Keflex   I saw her most recently for her physical in April She is generally in good health  Flu vaccine Recommend pneumococcal vaccination  Discussed the use of AI scribe software for clinical note transcription with the patient, who gave verbal consent to proceed.  History of Present Illness     Patient Active Problem List   Diagnosis Date Noted   History of basal cell carcinoma (BCC) 09/10/2023   Melanocytic nevus of trunk 09/10/2023   Loss of transverse plantar arch 02/10/2020   Anxiety disorder 04/06/2018   Acute medial meniscal tear, left, initial encounter 07/02/2017   Tear of MCL (medial collateral ligament) of knee, right, initial encounter 05/07/2017    Past Medical History:  Diagnosis Date   Allergy    seasonal   Anxiety     Past Surgical History:  Procedure Laterality Date   COLONOSCOPY  06/05/2021   jaw surgey     age 23   WISDOM TOOTH EXTRACTION      Social History   Tobacco Use   Smoking status: Never   Smokeless tobacco: Never  Vaping Use   Vaping status: Never Used  Substance Use Topics   Alcohol use: Yes    Comment: couple drinks 2 x per week   Drug use: No    Family History  Problem Relation Age of Onset   Colon cancer Neg Hx    Esophageal cancer Neg Hx    Colon polyps Neg Hx    Rectal cancer Neg Hx    Stomach cancer Neg Hx     No Known  Allergies  Medication list has been reviewed and updated.  Current Outpatient Medications on File Prior to Visit  Medication Sig Dispense Refill   Calcium Carbonate (CALCIUM 600 PO) Take 1 tablet by mouth daily.     cephALEXin  (KEFLEX ) 500 MG capsule Take 1 capsule (500 mg total) by mouth 2 (two) times daily. 14 capsule 0   escitalopram  (LEXAPRO ) 10 MG tablet Take 1 tablet (10 mg total) by mouth daily. 90 tablet 1   escitalopram  (LEXAPRO ) 20 MG tablet Take 1 tablet (20 mg total) by mouth daily. 90 tablet 3   famciclovir  (FAMVIR ) 500 MG tablet Take 1 tablet (500 mg total) by mouth 3 (three) times daily. 21 tablet 0   mirabegron  ER (MYRBETRIQ ) 25 MG TB24 tablet Take 1 tablet (25 mg total) by mouth daily. (Patient taking differently: Take 25 mg by mouth as needed.) 30 tablet 2   Multiple Vitamin (MULTIVITAMIN PO) Take by mouth daily. Women  take one tablet     Omega-3 Fatty Acids (FISH OIL PO) Take by mouth daily. Take one capsule daily     progesterone  (PROMETRIUM ) 100 MG capsule Take 1 capsule (100 mg total) by mouth at bedtime. 14 capsule 0   No current facility-administered medications on file prior  to visit.    Review of Systems:  As per HPI- otherwise negative.   Physical Examination: There were no vitals filed for this visit. There were no vitals filed for this visit. There is no height or weight on file to calculate BMI. Ideal Body Weight:    GEN: no acute distress. HEENT: Atraumatic, Normocephalic.  Ears and Nose: No external deformity. CV: RRR, No M/G/R. No JVD. No thrill. No extra heart sounds. PULM: CTA B, no wheezes, crackles, rhonchi. No retractions. No resp. distress. No accessory muscle use. ABD: S, NT, ND, +BS. No rebound. No HSM. EXTR: No c/c/e PSYCH: Normally interactive. Conversant.    Assessment and Plan: No diagnosis found.  Assessment & Plan   Signed Harlene Schroeder, MD

## 2024-02-16 ENCOUNTER — Ambulatory Visit: Admitting: Family Medicine

## 2024-02-18 ENCOUNTER — Ambulatory Visit: Admitting: Family Medicine

## 2024-02-18 ENCOUNTER — Encounter: Payer: Self-pay | Admitting: Family Medicine

## 2024-02-18 VITALS — BP 114/70 | HR 68 | Temp 98.2°F | Resp 16 | Ht 63.0 in

## 2024-02-18 DIAGNOSIS — B001 Herpesviral vesicular dermatitis: Secondary | ICD-10-CM | POA: Diagnosis not present

## 2024-02-18 DIAGNOSIS — Z23 Encounter for immunization: Secondary | ICD-10-CM

## 2024-02-18 MED ORDER — FAMCICLOVIR 500 MG PO TABS: 500.0000 mg | ORAL_TABLET | Freq: Three times a day (TID) | ORAL | 0 refills | Status: DC

## 2024-02-18 MED ORDER — FAMCICLOVIR 500 MG PO TABS: 500.0000 mg | ORAL_TABLET | Freq: Two times a day (BID) | ORAL | 0 refills | Status: AC

## 2024-02-18 NOTE — Patient Instructions (Signed)
 Good to see you again today- I am glad you are getting better Flu shot today We will continue famciclovir  for up to 10 days more, but we can back down to twice daily

## 2024-02-23 MED ORDER — PREDNISONE 20 MG PO TABS: ORAL_TABLET | ORAL | 0 refills | Status: AC

## 2024-02-23 MED ORDER — HYDROCODONE-ACETAMINOPHEN 5-325 MG PO TABS: 1.0000 | ORAL_TABLET | Freq: Three times a day (TID) | ORAL | 0 refills | Status: AC | PRN

## 2024-02-23 NOTE — Addendum Note (Signed)
 Addended by: WATT RAISIN C on: 02/23/2024 03:46 PM   Modules accepted: Orders

## 2024-02-23 NOTE — Addendum Note (Signed)
 Addended by: WATT RAISIN C on: 02/23/2024 03:35 PM   Modules accepted: Orders

## 2024-02-25 DIAGNOSIS — T148XXA Other injury of unspecified body region, initial encounter: Secondary | ICD-10-CM | POA: Diagnosis not present

## 2024-03-22 ENCOUNTER — Other Ambulatory Visit: Payer: Self-pay | Admitting: Family Medicine

## 2024-03-22 DIAGNOSIS — N3941 Urge incontinence: Secondary | ICD-10-CM

## 2024-03-22 MED ORDER — MIRABEGRON ER 25 MG PO TB24: 25.0000 mg | ORAL_TABLET | Freq: Every day | ORAL | 3 refills | Status: AC

## 2024-04-07 ENCOUNTER — Encounter: Payer: Self-pay | Admitting: Family Medicine

## 2024-04-07 DIAGNOSIS — F320 Major depressive disorder, single episode, mild: Secondary | ICD-10-CM

## 2024-04-08 MED ORDER — SERTRALINE HCL 50 MG PO TABS: 50.0000 mg | ORAL_TABLET | Freq: Every day | ORAL | 3 refills | Status: AC
